# Patient Record
Sex: Female | Born: 1970 | Race: White | Hispanic: No | Marital: Married | Smoking: Former smoker
Health system: Southern US, Community
[De-identification: ages and names within clinical notes are randomized; demographics above are authoritative.]

## PROBLEM LIST (undated history)

## (undated) DIAGNOSIS — F319 Bipolar disorder, unspecified: Secondary | ICD-10-CM

## (undated) DIAGNOSIS — F419 Anxiety disorder, unspecified: Secondary | ICD-10-CM

## (undated) HISTORY — PX: REFRACTIVE SURGERY: SHX103

---

## 2006-04-27 HISTORY — PX: AUGMENTATION MAMMAPLASTY: SUR837

## 2012-06-12 ENCOUNTER — Encounter (HOSPITAL_COMMUNITY): Payer: Self-pay

## 2012-06-12 ENCOUNTER — Emergency Department (HOSPITAL_COMMUNITY)
Admission: EM | Admit: 2012-06-12 | Discharge: 2012-06-13 | Disposition: A | Discharge status: Home / Self Care | Payer: BC Managed Care – PPO | Attending: Emergency Medicine | Admitting: Emergency Medicine

## 2012-06-12 DIAGNOSIS — F1020 Alcohol dependence, uncomplicated: Secondary | ICD-10-CM

## 2012-06-12 DIAGNOSIS — F319 Bipolar disorder, unspecified: Secondary | ICD-10-CM | POA: Insufficient documentation

## 2012-06-12 DIAGNOSIS — R45851 Suicidal ideations: Secondary | ICD-10-CM | POA: Insufficient documentation

## 2012-06-12 DIAGNOSIS — Z79899 Other long term (current) drug therapy: Secondary | ICD-10-CM | POA: Insufficient documentation

## 2012-06-12 DIAGNOSIS — Z8659 Personal history of other mental and behavioral disorders: Secondary | ICD-10-CM | POA: Insufficient documentation

## 2012-06-12 DIAGNOSIS — F1021 Alcohol dependence, in remission: Secondary | ICD-10-CM | POA: Insufficient documentation

## 2012-06-12 HISTORY — DX: Bipolar disorder, unspecified: F31.9

## 2012-06-12 LAB — RAPID URINE DRUG SCREEN, HOSP PERFORMED
Amphetamines: NOT DETECTED
Barbiturates: NOT DETECTED

## 2012-06-12 LAB — SALICYLATE LEVEL: Salicylate Lvl: <2 mg/dL — ABNORMAL LOW (ref 2.8–20.0)

## 2012-06-12 LAB — CBC
HCT: 42.4 % (ref 36.0–46.0)
Hemoglobin: 14.8 g/dL (ref 12.0–15.0)
MCV: 97.2 fL (ref 78.0–100.0)
RBC: 4.36 MIL/uL (ref 3.87–5.11)
WBC: 6.1 10*3/uL (ref 4.0–10.5)

## 2012-06-12 LAB — COMPREHENSIVE METABOLIC PANEL
Alkaline Phosphatase: 56 U/L (ref 39–117)
BUN: 9 mg/dL (ref 6–23)
CO2: 29 mEq/L (ref 19–32)
Chloride: 104 mEq/L (ref 96–112)
Creatinine, Ser: 0.6 mg/dL (ref 0.50–1.10)
GFR calc non Af Amer: >90 mL/min (ref >90)
Potassium: 4 mEq/L (ref 3.5–5.1)
Total Bilirubin: 0.1 mg/dL — ABNORMAL LOW (ref 0.3–1.2)

## 2012-06-12 LAB — ETHANOL: Alcohol, Ethyl (B): 189 mg/dL — ABNORMAL HIGH (ref 0–11)

## 2012-06-12 NOTE — ED Provider Notes (Signed)
History  This chart was scribed for Janet Lewis, non-physician practitioner working with Janet Booze, MD by Janet Lewis, ED Scribe. This patient was seen in room TR08C/TR08C and the patient's care was started at 9:55 PM.   CSN: [id number]  Arrival date & time 06/12/12  2115   First MD Initiated Contact with Patient 06/12/12 2155      Chief Complaint  Patient presents with  . Suicidal     The history is provided by the patient. No language interpreter was used.   Janet Lewis is a 42 y.o. female who presents to the Emergency Department complaining of one month of intermittent, gradually worsening suicidal ideations with a plan to rent a hotel room and overdose on medications. Pt reports that she was recently diagnosed with bipolar disorder and has been on Trileptal for the past month for this. Her psychiatrist is with Janet Lewis and she reports having an appointment in 3 days for follow up. She reports prior episodes of SI with her h/o depression but denies any prior suicide attempts.  She also has h/o ETOH abuse and was in [facility]hab last year for this but admits to relapsing several times since her release in April 2013. Her last alcohol consumption was yesterday. She states that she has been drinking daily for the past 2 weeks and denies prior alcohol withdrawal seizures. She denies illegal drug use. She denies nausea, emesis, diarrhea, CP and HA as associated symptoms. She denies smoking.  Past Medical History  Diagnosis Date  . Bipolar disorder     History reviewed. No pertinent past surgical history.  No family history on file.  History  Substance Use Topics  . Smoking status: Never Smoker   . Smokeless tobacco: Never Used  . Alcohol Use: Yes    No OB history provided.  Review of Systems  Constitutional: Negative for fever.  HENT: Negative for sore throat and rhinorrhea.   Eyes: Negative for redness.  Respiratory: Negative for cough.   Cardiovascular:  Negative for chest pain.  Gastrointestinal: Negative for nausea, vomiting, abdominal pain and diarrhea.  Genitourinary: Negative for dysuria.  Musculoskeletal: Negative for myalgias.  Skin: Negative for rash.  Neurological: Negative for seizures and headaches.  Psychiatric/Behavioral: Positive for suicidal ideas.    Allergies  Sulfa antibiotics and Neosporin  Home Medications   Current Outpatient Rx  Name  Route  Sig  Dispense  Refill  . OXcarbazepine (TRILEPTAL PO)   Oral   Take 1 tablet by mouth 2 (two) times daily.           Triage Vitals: BP 135/89  Pulse 103  Temp(Src) 97.7 F (36.5 C) (Oral)  Resp 16  SpO2 97%  LMP 06/05/2012  Physical Exam  Nursing note and vitals reviewed. Constitutional: She appears well-developed and well-nourished. No distress.  HENT:  Head: Normocephalic and atraumatic.  Eyes: Conjunctivae and EOM are normal. Right eye exhibits no discharge. Left eye exhibits no discharge.  Neck: Normal range of motion. Neck supple. No tracheal deviation present.  Cardiovascular: Normal rate, regular rhythm and normal heart sounds.   Pulmonary/Chest: Effort normal and breath sounds normal. No respiratory distress.  Abdominal: Soft. There is no tenderness.  Musculoskeletal: Normal range of motion.  Neurological: She is alert.  Skin: Skin is warm and dry.  Psychiatric: She expresses suicidal ideation.    ED Course  Procedures (including critical care time)  DIAGNOSTIC STUDIES: Oxygen Saturation is 97% on room air, adequate by my interpretation.    COORDINATION  OF CARE: 10:41 PM-Discussed treatment plan which includes CBC panel, urine drug screen, and ethanol with pt at bedside and pt agreed to plan.   Labs Reviewed  COMPREHENSIVE METABOLIC PANEL - Abnormal; Notable for the following:    Glucose, Bld 113 (*)    AST 102 (*)    ALT 72 (*)    Total Bilirubin 0.1 (*)    All other components within normal limits  ETHANOL - Abnormal; Notable for the  following:    Alcohol, Ethyl (B) 189 (*)    All other components within normal limits  SALICYLATE LEVEL - Abnormal; Notable for the following:    Salicylate Lvl <2.0 (*)    All other components within normal limits  ACETAMINOPHEN LEVEL  CBC  URINE RAPID DRUG SCREEN (HOSP PERFORMED)   No results found.   1. Suicidal ideation   2. Alcoholism    Vital signs reviewed and are as follows: Filed Vitals:   06/12/12 2120  BP: 135/89  Pulse: 103  Temp: 97.7 F (36.5 C)  Resp: 16   ACT aware of patient and will see.    MDM  SI, alcoholism. Pending ACT eval.      I personally performed the services described in this documentation, which was scribed in my presence. The recorded information has been reviewed and is accurate.Renne Crigler, PA 06/12/12 [phone]

## 2012-06-12 NOTE — ED Notes (Signed)
Security notified to wand patient.  

## 2012-06-12 NOTE — ED Notes (Signed)
ACT Team at bedside.  

## 2012-06-12 NOTE — ED Notes (Signed)
Sitter at bedside.

## 2012-06-12 NOTE — ED Provider Notes (Signed)
Medical screening examination/treatment/procedure(s) were performed by non-physician practitioner and as supervising physician I was immediately available for consultation/collaboration.   [name], MD 06/12/12 [phone]

## 2012-06-12 NOTE — ED Notes (Signed)
Patient presents with suicidal ideations with a plan to rent a hotel room and overdose on medications. Recently dx Bipolar and has been on medication (Trileptal) x 1 month for this. Patient states that being on this medication has made her "feel more normal than she ever has felt." patient also has hx ETOH abuse. Was in rehab ([facility]) last year for this but admits to relapsing several times since her release in April 2013. Patient is married with 3 children and states that she has a great support system of family/friends.

## 2012-06-12 NOTE — ED Notes (Signed)
Charge RN & Norwood Levo Selena Batten) notified of patient

## 2012-06-13 ENCOUNTER — Encounter (HOSPITAL_COMMUNITY): Payer: Self-pay | Admitting: *Deleted

## 2012-06-13 ENCOUNTER — Inpatient Hospital Stay (HOSPITAL_COMMUNITY)
Admission: AD | Admit: 2012-06-13 | Discharge: 2012-06-17 | DRG: 751 | Disposition: A | Discharge status: Home / Self Care | Payer: BC Managed Care – PPO | Source: Ambulatory Visit | Attending: Psychiatry | Admitting: Psychiatry

## 2012-06-13 DIAGNOSIS — F319 Bipolar disorder, unspecified: Secondary | ICD-10-CM | POA: Diagnosis present

## 2012-06-13 DIAGNOSIS — Z79899 Other long term (current) drug therapy: Secondary | ICD-10-CM

## 2012-06-13 DIAGNOSIS — F39 Unspecified mood [affective] disorder: Secondary | ICD-10-CM

## 2012-06-13 DIAGNOSIS — F1020 Alcohol dependence, uncomplicated: Principal | ICD-10-CM

## 2012-06-13 MED ORDER — ONDANSETRON 4 MG PO TBDP: 4.0000 mg | ORAL_TABLET | Freq: Four times a day (QID) | ORAL | Status: AC | PRN

## 2012-06-13 MED ORDER — FOLIC ACID 1 MG PO TABS
1.0000 mg | ORAL_TABLET | Freq: Every day | ORAL | Status: DC
  Administered 2012-06-13: 1 mg via ORAL
  Filled 2012-06-13: qty 1

## 2012-06-13 MED ORDER — HYDROXYZINE HCL 25 MG PO TABS: 25.0000 mg | ORAL_TABLET | Freq: Four times a day (QID) | ORAL | Status: AC | PRN

## 2012-06-13 MED ORDER — ALUM & MAG HYDROXIDE-SIMETH 200-200-20 MG/5ML PO SUSP: 30.0000 mL | ORAL | Status: DC | PRN

## 2012-06-13 MED ORDER — IBUPROFEN 200 MG PO TABS
600.0000 mg | ORAL_TABLET | Freq: Three times a day (TID) | ORAL | Status: DC | PRN
  Administered 2012-06-13: 600 mg via ORAL
  Filled 2012-06-13: qty 3

## 2012-06-13 MED ORDER — ACETAMINOPHEN 325 MG PO TABS: 650.0000 mg | ORAL_TABLET | Freq: Four times a day (QID) | ORAL | Status: DC | PRN

## 2012-06-13 MED ORDER — VITAMIN B-1 100 MG PO TABS
100.0000 mg | ORAL_TABLET | Freq: Every day | ORAL | Status: DC
  Administered 2012-06-14 – 2012-06-16 (×3): 100 mg via ORAL
  Filled 2012-06-13 (×6): qty 1

## 2012-06-13 MED ORDER — TRAZODONE HCL 50 MG PO TABS
50.0000 mg | ORAL_TABLET | Freq: Every evening | ORAL | Status: DC | PRN
  Administered 2012-06-13 – 2012-06-16 (×4): 50 mg via ORAL
  Filled 2012-06-13 (×3): qty 1
  Filled 2012-06-13: qty 4

## 2012-06-13 MED ORDER — LORAZEPAM 1 MG PO TABS: 0.0000 mg | ORAL_TABLET | Freq: Four times a day (QID) | ORAL | Status: DC

## 2012-06-13 MED ORDER — VITAMIN B-1 100 MG PO TABS
100.0000 mg | ORAL_TABLET | Freq: Every day | ORAL | Status: DC
  Administered 2012-06-13: 100 mg via ORAL
  Filled 2012-06-13: qty 1

## 2012-06-13 MED ORDER — ONDANSETRON HCL 8 MG PO TABS: 4.0000 mg | ORAL_TABLET | Freq: Three times a day (TID) | ORAL | Status: DC | PRN

## 2012-06-13 MED ORDER — NALTREXONE HCL 50 MG PO TABS
25.0000 mg | ORAL_TABLET | Freq: Every day | ORAL | Status: DC
  Administered 2012-06-13 – 2012-06-17 (×5): 25 mg via ORAL
  Filled 2012-06-13: qty 1
  Filled 2012-06-13: qty 2
  Filled 2012-06-13 (×5): qty 1

## 2012-06-13 MED ORDER — OXCARBAZEPINE 300 MG PO TABS
300.0000 mg | ORAL_TABLET | Freq: Two times a day (BID) | ORAL | Status: DC
  Administered 2012-06-13 – 2012-06-17 (×8): 300 mg via ORAL
  Filled 2012-06-13 (×8): qty 1
  Filled 2012-06-13: qty 8
  Filled 2012-06-13 (×2): qty 1
  Filled 2012-06-13: qty 8

## 2012-06-13 MED ORDER — ACETAMINOPHEN 325 MG PO TABS: 650.0000 mg | ORAL_TABLET | ORAL | Status: DC | PRN

## 2012-06-13 MED ORDER — THIAMINE HCL 100 MG/ML IJ SOLN: 100.0000 mg | Freq: Every day | INTRAMUSCULAR | Status: DC

## 2012-06-13 MED ORDER — THIAMINE HCL 100 MG/ML IJ SOLN: 100.0000 mg | Freq: Once | INTRAMUSCULAR | Status: DC

## 2012-06-13 MED ORDER — CHLORDIAZEPOXIDE HCL 25 MG PO CAPS: 25.0000 mg | ORAL_CAPSULE | Freq: Four times a day (QID) | ORAL | Status: AC | PRN

## 2012-06-13 MED ORDER — ADULT MULTIVITAMIN W/MINERALS CH
1.0000 | ORAL_TABLET | Freq: Every day | ORAL | Status: DC
  Administered 2012-06-13: 1 via ORAL
  Filled 2012-06-13: qty 1

## 2012-06-13 MED ORDER — MAGNESIUM HYDROXIDE 400 MG/5ML PO SUSP: 30.0000 mL | Freq: Every day | ORAL | Status: DC | PRN

## 2012-06-13 MED ORDER — LOPERAMIDE HCL 2 MG PO CAPS: 2.0000 mg | ORAL_CAPSULE | ORAL | Status: AC | PRN

## 2012-06-13 MED ORDER — NICOTINE 21 MG/24HR TD PT24
21.0000 mg | MEDICATED_PATCH | Freq: Every day | TRANSDERMAL | Status: DC
  Filled 2012-06-13: qty 1

## 2012-06-13 MED ORDER — LORAZEPAM 1 MG PO TABS: 1.0000 mg | ORAL_TABLET | Freq: Four times a day (QID) | ORAL | Status: DC | PRN

## 2012-06-13 MED ORDER — ADULT MULTIVITAMIN W/MINERALS CH
1.0000 | ORAL_TABLET | Freq: Every day | ORAL | Status: DC
Start: 2012-06-13 — End: 2012-06-17
  Administered 2012-06-13 – 2012-06-16 (×4): 1 via ORAL
  Filled 2012-06-13 (×7): qty 1

## 2012-06-13 MED ORDER — LORAZEPAM 1 MG PO TABS: 0.0000 mg | ORAL_TABLET | Freq: Two times a day (BID) | ORAL | Status: DC

## 2012-06-13 MED ORDER — LORAZEPAM 2 MG/ML IJ SOLN: 1.0000 mg | Freq: Four times a day (QID) | INTRAMUSCULAR | Status: DC | PRN

## 2012-06-13 NOTE — Tx Team (Signed)
Initial Interdisciplinary Treatment Plan  PATIENT STRENGTHS: (choose at least two) Ability for insight Active sense of humor Average or above average intelligence Capable of independent living Communication skills General fund of knowledge Motivation for treatment/growth Physical Health Supportive family/friends Work skills  PATIENT STRESSORS: Marital or family conflict Substance abuse   PROBLEM LIST: Problem List/Patient Goals Date to be addressed Date deferred Reason deferred Estimated date of resolution  Worried about her spouse he kicks me out every time I binge drink 06/13/2012   06/18/2012  My job is the only thing my drinking has not affected 06/13/12   06/18/2012  I have just started taking trileptal for my mood disorder and I believe the doctor was going to increase my medication 06/13/2012   06/18/2012                                       DISCHARGE CRITERIA:  Ability to meet basic life and health needs Adequate post-discharge living arrangements Improved stabilization in mood, thinking, and/or behavior Motivation to continue treatment in a less acute level of care Need for constant or close observation no longer present Verbal commitment to aftercare and medication compliance Withdrawal symptoms are absent or subacute and managed without 24-hour nursing intervention  PRELIMINARY DISCHARGE PLAN: Attend aftercare/continuing care group Attend 12-step recovery group Return to previous living arrangement Return to previous work or school arrangements  PATIENT/FAMIILY INVOLVEMENT: This treatment plan has been presented to and reviewed with the patient, Janet Lewis..  The patient and family have been given the opportunity to ask questions and make suggestions.  Jule Ser 06/13/2012, 7:50 PM

## 2012-06-13 NOTE — Progress Notes (Signed)
Pt reports she just came in this evening for alcohol detox.  She said she is not really having any withdrawal symptoms at this time, because it has been about 3 days since she has had a drink.  She said her husband had kicked her out of the house to get her away from the kids, and she had been staying with her parents.  She denies SI/HI/AV.  She said the MD has ordered her something for cravings, as that is her biggest obstacle.  She is hoping that it will help her to curb the desire to drink.  Pt's thoughts were clear in conversation.  Support/encouragement given.  Pt encouraged to make her needs known to staff.  Pt voiced understanding.  Safety maintained with q15 minute checks.

## 2012-06-13 NOTE — ED Provider Notes (Addendum)
Janet Lewis is a 42 y.o. female here with suicidal ideations and substance abuse. Came in last night. Sleeping comfortably this AM, no issues as per nursing. Will get telepsych and ACT consult today. Will also place on CIWA protocol given hx of recent alcohol binge. Medically stable otherwise.    Richardean Canal, MD 06/13/12 [phone]  11:14 AM Patient accepted at [facility] under Dr. Les Pou. Stable for transfer.   Richardean Canal, MD 06/13/12 1115

## 2012-06-13 NOTE — ED Notes (Signed)
TELEPSYCH ORDERED

## 2012-06-13 NOTE — Progress Notes (Signed)
[facility] LCSW Group Therapy  06/13/2012   Type of Therapy:  Group Therapy  Participation Level:  Did Not Attend; new patient was meeting with physician   Clide Dales 06/13/2012, 5:29 PM

## 2012-06-13 NOTE — Progress Notes (Signed)
Adult Psychoeducational Group Note  Date:  06/13/2012 Time:  2000  Group Topic/Focus:  AA group  Participation Level:  Active  Participation Quality:  Appropriate and Attentive  Affect:  Appropriate  Cognitive:  Alert and Appropriate  Insight: Appropriate  Engagement in Group:  Engaged  Modes of Intervention:  Support  Additional Comments:    Humberto Seals Monique 06/13/2012, 11:20 PM

## 2012-06-13 NOTE — [facility] Suicide Risk Assessment (Signed)
Suicide Risk Assessment  Admission Assessment     Nursing information obtained from:    Demographic factors:    Current Mental Status:    Loss Factors:    Historical Factors:    Risk Reduction Factors:     CLINICAL FACTORS:   Bipolar Disorder:   Depressive phase Alcohol/Substance Abuse/Dependencies  COGNITIVE FEATURES THAT CONTRIBUTE TO RISK: No evidence   SUICIDE RISK:   Mild:  Suicidal ideation of limited frequency, intensity, duration, and specificity.  There are no identifiable plans, no associated intent, mild dysphoria and related symptoms, good self-control (both objective and subjective assessment), few other risk factors, and identifiable protective factors, including available and accessible social support.  PLAN OF CARE: Librium detox                              Supportive approach/coping skills/relapse prevention                               Increase Trileptal                               Start Naltrexone  I certify that inpatient services furnished can reasonably be expected to improve the patient's condition.  Laiklyn Pilkenton A 06/13/2012, 2:39 PM

## 2012-06-13 NOTE — BH Assessment (Signed)
Assessment Note   Janet Lewis is an 42 y.o. female. Pt reports long history of alcohol problems.  Pt was supposed to take vacation with husband this past week but was snowed out.  Today, after 3 weeks sobriety, pt drank again and was confronted by her husband.  After an argument, he told her to leave.  Pt reporting SI with plan to check into a hotel and take an overdose of pills.  Pt reports he husband monitors her every move to try to prevent her from drinking.  Pt has been somewhat recently diagnosed with bipolar disorder and started meds 1 month ago, which has been helpful.  Pt does report that, despite attending AA meetings, her urge to drink is still very strong.  Pt was at [facility] 07/2011 but did not remain sober.  Pt does report depression, SI with plan, denies HI/AV.  Pt does not report current withdrawals.  Axis I: alcohol dependence, bipolar disorder Axis II: Deferred Axis III:  Past Medical History  Diagnosis Date  . Bipolar disorder    Axis IV: problems with primary support group Axis V: 21-30 behavior considerably influenced by delusions or hallucinations OR serious impairment in judgment, communication OR inability to function in almost all areas  Past Medical History:  Past Medical History  Diagnosis Date  . Bipolar disorder     History reviewed. No pertinent past surgical history.  Family History: No family history on file.  Social History:  reports that she has never smoked. She has never used smokeless tobacco. She reports that  drinks alcohol. She reports that she does not use illicit drugs.  Additional Social History:  Alcohol / Drug Use Pain Medications: Pt denies. Prescriptions: Pt denies. Over the Counter: Pt denies. History of alcohol / drug use?: Yes Longest period of sobriety (when/how long): 3 weeks-ended today Negative Consequences of Use: Personal relationships;Financial;Legal Substance #1 Name of Substance 1: alcohol 1 - Age of First Use:  16 1 - Amount (size/oz): 1/2 pint vodka 1 - Frequency: daily, by historty--has not drank for 3 weeks until today. 1 - Duration: 5-6 years 1 - Last Use / Amount: 2/16 1 bottle wine  CIWA: CIWA-Ar BP: 135/89 mmHg Pulse Rate: 103 COWS:    Allergies:  Allergies  Allergen Reactions  . Sulfa Antibiotics Hives  . Neosporin (Neomycin-Bacitracin Zn-Polymyx) Rash    Home Medications:  (Not in a hospital admission)  OB/GYN Status:  Patient's last menstrual period was 06/05/2012.  General Assessment Data [facility] of Assessment: [facility] ED ACT Assessment: Yes Living Arrangements: Spouse/significant other;Children Can pt return to current living arrangement?: Yes Admission Status: Voluntary     Risk to self Suicidal Ideation: Yes-Currently Present Suicidal Intent: Yes-Currently Present Is patient at risk for suicide?: Yes Suicidal Plan?: Yes-Currently Present Specify Current Suicidal Plan: overdose Access to Means: Yes Specify Access to Suicidal Means: buy pills What has been your use of drugs/alcohol within the last 12 months?: long history of alcohol problems Previous Attempts/Gestures: No Intentional Self Injurious Behavior: None Family Suicide History: No Recent stressful life event(s): Conflict (Comment);Legal Issues (with husband related to pt alcohol use, pending DWI) Persecutory voices/beliefs?: No Depression: Yes Depression Symptoms: Despondent;Tearfulness;Isolating;Fatigue;Guilt;Loss of interest in usual pleasures;Feeling worthless/self pity;Feeling angry/irritable Substance abuse history and/or treatment for substance abuse?: Yes Suicide prevention information given to non-admitted patients: Not applicable  Risk to Others Homicidal Ideation: No Thoughts of Harm to Others: No Current Homicidal Intent: No Current Homicidal Plan: No Access to Homicidal Means: No History of harm  to others?: No Assessment of Violence: None Noted Does patient have access to weapons?: Yes  (Comment) (hunting guns in home) Criminal Charges Pending?: Yes Describe Pending Criminal Charges: DWI Does patient have a court date: Yes Court Date: 07/18/12  Psychosis Hallucinations: None noted Delusions: None noted  Mental Status Report Appear/Hygiene: Other (Comment) (casual) Eye Contact: Good Motor Activity: Unremarkable Speech: Other (Comment) (clear speech but vague in what she was saying) Level of Consciousness: Alert Mood: Depressed Affect: Appropriate to circumstance Anxiety Level: Minimal Thought Processes: Relevant Judgement: Unimpaired Orientation: Person;Place;Time;Situation Obsessive Compulsive Thoughts/Behaviors: None  Cognitive Functioning Concentration: Normal Memory: Recent Intact;Remote Intact IQ: Average Insight: Good Impulse Control: Fair Appetite: Good Weight Loss: 0 Weight Gain: 20 Sleep: No Change Total Hours of Sleep: 5 Vegetative Symptoms: None  ADLScreening [facility] Assessment Services) Patient's cognitive ability adequate to safely complete daily activities?: Yes Patient able to express need for assistance with ADLs?: Yes Independently performs ADLs?: Yes (appropriate for developmental age)  Abuse/Neglect [facility]) Physical Abuse: Denies Verbal Abuse: Denies Sexual Abuse: Denies  Prior Inpatient Therapy Prior Inpatient Therapy: Yes Prior Therapy Dates: 07/2011 Prior Therapy Facilty[facility]Provider[facility]): [facility] Reason for Treatment: alcohol  Prior Outpatient Therapy Prior Outpatient Therapy: Yes Prior Therapy Dates: current Prior Therapy Facilty/Provider(s): [facility] Reason for Treatment: meds  ADL Screening (condition at time of admission) Patient's cognitive ability adequate to safely complete daily activities?: Yes Patient able to express need for assistance with ADLs?: Yes Independently performs ADLs?: Yes (appropriate for developmental age) Weakness of Legs: None Weakness of Arms/Hands: None  Home Assistive  Devices/Equipment Home Assistive Devices/Equipment: None    Abuse/Neglect Assessment (Assessment to be complete while patient is alone) Physical Abuse: Denies Verbal Abuse: Denies Sexual Abuse: Denies Exploitation of patient/patient's resources: Denies Self-Neglect: Denies Values / Beliefs Cultural Requests During Hospitalization: None Spiritual Requests During Hospitalization: None   Advance Directives (For Healthcare) Advance Directive: Patient does not have advance directive;Patient would not like information    Additional Information 1:1 In Past 12 Months?: No CIRT Risk: No Elopement Risk: No Does patient have medical clearance?: Yes     Disposition:  Disposition Disposition of Patient: Inpatient treatment program Type of inpatient treatment program: Adult  On Site Evaluation by:   Reviewed with Physician:     Lorri Frederick 06/13/2012 12:01 AM

## 2012-06-13 NOTE — Progress Notes (Signed)
Pt was admitted today for substance abuse today.  She has been drinking for the past 5-6 years and has only remained sober for about 7 days (except when she has been somewhere to detox) She is now wanting help her husband of 20 years kicks her out of their home and away from the children.  She has been to [facility] several times but never here for treatment.  She has no pertinent medical issues only breast implants around 2008.  She denies any S/H ideation or A/V hallucination.  Her CIWA was a 4 on admission with minor nausea and anxiety.  She will not be started on the protocol but has prn librium as needed.

## 2012-06-13 NOTE — ED Notes (Signed)
REPORT HAS BEEN CALLED TO BH. SECURITY AWARE AND HERE FOR TRANSPORT

## 2012-06-13 NOTE — Progress Notes (Signed)
Pt accepted by Dr Dan Humphreys to Dr Dub Mikes pending bed availability.

## 2012-06-13 NOTE — H&P (Signed)
Psychiatric Admission Assessment Adult  Patient Identification:  Janet Lewis Date of Evaluation:  06/13/2012 Chief Complaint:  Alcohol Dependence 303.90 Bipolar Disorder NOS 296.80 History of Present Illness:: Back in March 2013 she went to [facility] 3 days for detox, then 28 days in [facility] .She relapsed a month after she left. The cravings were still there. She recognizes that she has a Bipolar Disorder. Started drinking to "stop the madness." Racing thoughts, recognizes that they have been going on for 15 years. Started drinking 6 years ago, and progressed rapidly. Had post partum first two pregnancies, given Zoloft 8 weeks. Found it helpful Seasonal component. Used to pick her skin Elements:  Location:  In patient. Quality:  affecting her everyday. Severity:  moderate to severe. Timing:  every day. Duration:  worst last several months. Context:  Alcohol dependence with an underlying mood disorder. Associated Signs/Synptoms: Depression Symptoms:  depressed mood, insomnia, psychomotor retardation, fatigue, recurrent thoughts of death, suicidal thoughts without plan, anxiety, panic attacks, insomnia, loss of energy/fatigue, disturbed sleep, weight gain, mostly when she drinks (Hypo) Manic Symptoms:  Impulsivity, Irritable Mood,racing thoughts, no sleep when she is that state, spending money, labile Anxiety Symptoms:  Excessive Worry, Panic Symptoms, Psychotic Symptoms:  Denies PTSD Symptoms: Had a traumatic exposure:  abortion at 17  Psychiatric Specialty Exam: Physical Exam  ROS  Blood pressure 131/91, pulse 88, temperature 96.2 F (35.7 C), temperature source Oral, height 5\' 7"  (1.702 m), weight 81.647 kg (180 lb), last menstrual period 06/05/2012.Body mass index is 28.19 kg/(m^2).  General Appearance: Fairly Groomed  Patent attorney::  Fair  Speech:  Clear and Coherent and Normal Rate  Volume:  Decreased  Mood:  Anxious, Depressed and worried  Affect:   Restricted  Thought Process:  Coherent and Goal Directed  Orientation:  Full (Time, Place, and Person)  Thought Content:  worries, concerns  Suicidal Thoughts:  No  Homicidal Thoughts:  No  Memory:  Immediate;   Fair Recent;   Fair Remote;   Fair  Judgement:  Fair  Insight:  Present  Psychomotor Activity:  Normal  Concentration:  Fair  Recall:  Fair  Akathisia:  No  Handed:  Right  AIMS (if indicated):     Assets:  Desire for Improvement Housing Social Support Talents/Skills Vocational/Educational  Sleep:       Past Psychiatric History: Diagnosis: Alcohol Dependence, Mood Disorder  Hospitalizations: [facility].   Outpatient Care:  Substance Abuse Care: [facility]   Self-Mutilation: Denies  Suicidal Attempts:Denies  Violent Behaviors:Denies   Past Medical History:   Past Medical History  Diagnosis Date  . Bipolar disorder    Seizure History:  coning off the alcohol Allergies:   Allergies  Allergen Reactions  . Sulfa Antibiotics Hives  . Neosporin (Neomycin-Bacitracin Zn-Polymyx) Rash   PTA Medications: Prescriptions prior to admission  Medication Sig Dispense Refill  . OXcarbazepine (TRILEPTAL PO) Take 1 tablet by mouth 2 (two) times daily.        Previous Psychotropic Medications:  Medication/Dose  Zoloft, Celexa, Trileptal               Substance Abuse History in the last 12 months:  yes  Consequences of Substance Abuse: Legal Consequences:  DWI, drving without a license Family Consequences:  stress in the relationship Blackouts:   Withdrawal Symptoms:   cravings  Social History:  reports that she has never smoked. She has never used smokeless tobacco. She reports that she drinks about  4.8 ounces of alcohol per week. She reports that she does not use illicit drugs. Additional Social History: Pain Medications: pt denies Prescriptions: pt denies Over the Counter: pt denies History of alcohol / drug  use?: Yes Longest period of sobriety (when/how long): 7 days sober Negative Consequences of Use: Personal relationships;Legal;Financial Withdrawal Symptoms: Nausea / Vomiting;Other (Comment) (anxiety) Name of Substance 1: alcohol sunday night 1 - Age of First Use: 16 1 - Amount (size/oz): 1/2 pint vodka per day 1 - Frequency: daily 1 - Duration: 6 years 1 - Last Use / Amount: 2/16 1 bottle wine                  Current Place of Residence:   Place of Birth:   Family Members:  Marital Status:  Married Children:  Sons: 14, 9  Daughters: 17 Relationships: Education:  2 years occupational therapy Educational Problems/Performance: Religious Beliefs/Practices: History of Abuse (Emotional/Phsycial/Sexual) Occupational Experiences; In nursing home Military History:  None. Legal History: DWI Hobbies/Interests:  Family History:  History reviewed. No pertinent family history., Alcoholism Bipolar Disorder  Results for orders placed during the hospital encounter of 06/12/12 (from the past 72 hour(s))  ACETAMINOPHEN LEVEL     Status: None   Collection Time    06/12/12  9:49 PM      Result Value Range   Acetaminophen (Tylenol), Serum <15.0  10 - 30 ug/mL   Comment:            THERAPEUTIC CONCENTRATIONS VARY     SIGNIFICANTLY. A RANGE OF 10-30     ug/mL MAY BE AN EFFECTIVE     CONCENTRATION FOR MANY PATIENTS.     HOWEVER, SOME ARE BEST TREATED     AT CONCENTRATIONS OUTSIDE THIS     RANGE.     ACETAMINOPHEN CONCENTRATIONS     >150 ug/mL AT 4 HOURS AFTER     INGESTION AND >50 ug/mL AT 12     HOURS AFTER INGESTION ARE     OFTEN ASSOCIATED WITH TOXIC     REACTIONS.  CBC     Status: None   Collection Time    06/12/12  9:49 PM      Result Value Range   WBC 6.1  4.0 - 10.5 K/uL   RBC 4.36  3.87 - 5.11 MIL/uL   Hemoglobin 14.8  12.0 - 15.0 g/dL   HCT 40.9  81.1 - 91.4 %   MCV 97.2  78.0 - 100.0 fL   MCH 33.9  26.0 - 34.0 pg   MCHC 34.9  30.0 - 36.0 g/dL   RDW 78.2  95.6 -  21.3 %   Platelets 239  150 - 400 K/uL  COMPREHENSIVE METABOLIC PANEL     Status: Abnormal   Collection Time    06/12/12  9:49 PM      Result Value Range   Sodium 142  135 - 145 mEq/L   Potassium 4.0  3.5 - 5.1 mEq/L   Chloride 104  96 - 112 mEq/L   CO2 29  19 - 32 mEq/L   Glucose, Bld 113 (*) 70 - 99 mg/dL   BUN 9  6 - 23 mg/dL   Creatinine, Ser 0.86  0.50 - 1.10 mg/dL   Calcium 9.1  8.4 - 57.8 mg/dL   Total Protein 7.6  6.0 - 8.3 g/dL   Albumin 3.8  3.5 - 5.2 g/dL   AST 469 (*) 0 - 37 U/L   ALT 72 (*) 0 -  35 U/L   Alkaline Phosphatase 56  39 - 117 U/L   Total Bilirubin 0.1 (*) 0.3 - 1.2 mg/dL   GFR calc non Af Amer >90  >90 mL/min   GFR calc Af Amer >90  >90 mL/min   Comment:            The eGFR has been calculated     using the CKD EPI equation.     This calculation has not been     validated in all clinical     situations.     eGFR's persistently     <90 mL/min signify     possible Chronic Kidney Disease.  ETHANOL     Status: Abnormal   Collection Time    06/12/12  9:49 PM      Result Value Range   Alcohol, Ethyl (B) 189 (*) 0 - 11 mg/dL   Comment:            LOWEST DETECTABLE LIMIT FOR     SERUM ALCOHOL IS 11 mg/dL     FOR MEDICAL PURPOSES ONLY  SALICYLATE LEVEL     Status: Abnormal   Collection Time    06/12/12  9:49 PM      Result Value Range   Salicylate Lvl <2.0 (*) 2.8 - 20.0 mg/dL  URINE RAPID DRUG SCREEN (HOSP PERFORMED)     Status: None   Collection Time    06/12/12  9:54 PM      Result Value Range   Opiates NONE DETECTED  NONE DETECTED   Cocaine NONE DETECTED  NONE DETECTED   Benzodiazepines NONE DETECTED  NONE DETECTED   Amphetamines NONE DETECTED  NONE DETECTED   Tetrahydrocannabinol NONE DETECTED  NONE DETECTED   Barbiturates NONE DETECTED  NONE DETECTED   Comment:            DRUG SCREEN FOR MEDICAL PURPOSES     ONLY.  IF CONFIRMATION IS NEEDED     FOR ANY PURPOSE, NOTIFY LAB     WITHIN 5 DAYS.                LOWEST DETECTABLE LIMITS      FOR URINE DRUG SCREEN     Drug Class       Cutoff (ng/mL)     Amphetamine      1000     Barbiturate      200     Benzodiazepine   200     Tricyclics       300     Opiates          300     Cocaine          300     THC              50   Psychological Evaluations:  Assessment:   AXIS I:  Alcohol Dependence, Mood Disorder NOS AXIS II:  Deferred AXIS III:   Past Medical History  Diagnosis Date  . Bipolar disorder    AXIS IV:  problems related to legal system/crime and problems with primary support group AXIS V:  51-60 moderate symptoms  Treatment Plan/Recommendations:  Supportive approach/coping skills/relapse prevention  Librium Detox                                                                  Reassess co morbidities  Treatment Plan Summary: Daily contact with patient to assess and evaluate symptoms and progress in treatment Medication management Current Medications:  No current facility-administered medications for this encounter.    Observation Level/Precautions:  15 minute checks  Laboratory:  As per ED  Psychotherapy:  Individual/Group/relapse prevention    Medications:  Librium Detox  Consultations:    Discharge Concerns:    Estimated LOS: 5-7 days  Other:     I certify that inpatient services furnished can reasonably be expected to improve the patient's condition.   Tennille Montelongo A 2/17/20141:53 PM

## 2012-06-14 NOTE — Progress Notes (Signed)
Adult Psychoeducational Group Note  Date:  06/14/2012 Time:  2000 Group Topic/Focus:  AA--Addiction  Participation Level:  Active  Participation Quality:  Appropriate, Attentive, Sharing and Supportive  Affect:  Appropriate  Cognitive:  Alert, Appropriate and Oriented  Insight: Appropriate  Engagement in Group:  Engaged and Supportive  Modes of Intervention:  Socialization and Support  Additional Comments:  AA members did not arrive; patients watched a video on addiction. Pt remained throughout the entirety of the group; pt was supportive and shared with peers.   Humberto [facility] 06/14/2012, 9:38 PM

## 2012-06-14 NOTE — Progress Notes (Signed)
Pt observed in the dayroom watching TV with her peers.  In 1:1 conversation, pt reports she is doing ok today.  She is not sure if she wants to go to a rehab before going home or not.  She talked with Dr. Dub Mikes about taking an injection instead of trusting herself to take a daily pill.  She is still thinking about her options.  She is thinking in a logical manner.  She denies SI/HI/AV.  She voices no needs/concerns.  Support/encouragement given.  Safety maintained with q15 minutes.

## 2012-06-14 NOTE — Progress Notes (Signed)
[facility] MD Progress Note  06/14/2012 10:18 AM Janet Lewis  MRN:  [id number]  Subjective:  "I actually feel a lot better today than I was few days ago. I slept really well last night for a place like this. I came here because of my alcoholism. [facility]. I drink alcohol as a self medication for having bipolar disorder. I did not want to admit that I have this mental illness, and I was not prepared to deal with taking medicines everyday for being crazy. I drink to help me sleep at night. I drink to help me with all the stuff going in my thinking/my head. I have an uncle who has manic type of bipolar disorder. He is really crazy. He does not take his medications, and as a result he looks and acts crazy. He used to tell me that I am like him and will always be like him. I have started medication for bipolar disorder recently, but I was not complaint with it. I will stop my medicines so that I can drink my alcohol. I crave alcohol, think about alcohol all the time. I have done AA meetings, will still do AA meetings after I get out from here. Alcoholism has done a number on me. It has affected my relationships, family, life and job. I was charged with DUI last May.  I am doing things differently once I leave here. I will continue my AA/NA meetings and join a peer group to facilitate my healing. I pray that the new medicine Naltrexone will help me deal with my alcoholism even further".  Diagnosis:   Axis I: Alcohol dependence, Unspecified mood disorder. Axis II: Deferred Axis III:  Past Medical History  Diagnosis Date  . Bipolar disorder    Axis IV: other psychosocial or environmental problems and Substance abuse issues. Axis V: 41-50 serious symptoms  ADL's:  Intact  Sleep: Good  Appetite:  Fair  Suicidal Ideation: "No" Plan:  Denies Intent:  Denies Means:  Denies  Homicidal Ideation: "No" Plan:  No Intent:  No Means:  No  AEB (as evidenced  by): per patient's report.  Psychiatric Specialty Exam: Review of Systems  Constitutional: Negative.   HENT: Negative.   Eyes: Negative.   Respiratory: Negative.   Cardiovascular: Negative.   Gastrointestinal: Negative.   Genitourinary: Negative.   Musculoskeletal: Negative.   Skin: Negative.   Neurological: Negative.   Endo/Heme/Allergies: Negative.   Psychiatric/Behavioral: Positive for substance abuse. Negative for depression, suicidal ideas, hallucinations and memory loss. The patient has insomnia. The patient is not nervous/anxious.     Blood pressure 118/81, pulse 96, temperature 97.9 F (36.6 C), temperature source Oral, resp. rate 16, height 5\' 7"  (1.702 m), weight 81.647 kg (180 lb), last menstrual period 06/05/2012.Body mass index is 28.19 kg/(m^2).  General Appearance: Casual and Fairly Groomed  Patent attorney::  Fair  Speech:  Clear and Coherent and pressured to some extent  Volume:  Increased  Mood:  "I feel a little anxious".  Affect:  Blunt  Thought Process:  Coherent and Goal Directed  Orientation:  Full (Time, Place, and Person)  Thought Content:  Rumination and Denies hallucinations, delusions, paranoia  Suicidal Thoughts:  No  Homicidal Thoughts:  No  Memory:  Immediate;   Good Recent;   Good Remote;   Good  Judgement:  Fair  Insight:  Fair  Psychomotor Activity:  Anxious  Concentration:  Fair  Recall:  Good  Akathisia:  No  Handed:  Right  AIMS (if indicated):     Assets:  Desire for Improvement  Sleep:  Number of Hours: 6.75   Current Medications: Current Facility-Administered Medications  Medication Dose Route Frequency Provider Last Rate Last Dose  . acetaminophen (TYLENOL) tablet 650 mg  650 mg Oral Q6H PRN Rachael Fee, MD      . alum & mag hydroxide-simeth (MAALOX/MYLANTA) 200-200-20 MG/5ML suspension 30 mL  30 mL Oral Q4H PRN Rachael Fee, MD      . chlordiazePOXIDE (LIBRIUM) capsule 25 mg  25 mg Oral Q6H PRN Rachael Fee, MD      .  hydrOXYzine (ATARAX/VISTARIL) tablet 25 mg  25 mg Oral Q6H PRN Rachael Fee, MD      . loperamide (IMODIUM) capsule 2-4 mg  2-4 mg Oral PRN Rachael Fee, MD      . magnesium hydroxide (MILK OF MAGNESIA) suspension 30 mL  30 mL Oral Daily PRN Rachael Fee, MD      . multivitamin with minerals tablet 1 tablet  1 tablet Oral Daily Rachael Fee, MD   1 tablet at 06/14/12 1610  . naltrexone (DEPADE) tablet 25 mg  25 mg Oral Daily Rachael Fee, MD   25 mg at 06/14/12 0802  . ondansetron (ZOFRAN-ODT) disintegrating tablet 4 mg  4 mg Oral Q6H PRN Rachael Fee, MD      . Oxcarbazepine (TRILEPTAL) tablet 300 mg  300 mg Oral BID Rachael Fee, MD   300 mg at 06/14/12 0802  . thiamine (B-1) injection 100 mg  100 mg Intramuscular Once Rachael Fee, MD      . thiamine (VITAMIN B-1) tablet 100 mg  100 mg Oral Daily Rachael Fee, MD   100 mg at 06/14/12 0803  . traZODone (DESYREL) tablet 50 mg  50 mg Oral QHS PRN Rachael Fee, MD   50 mg at 06/13/12 2201    Lab Results:  Results for orders placed during the hospital encounter of 06/12/12 (from the past 48 hour(s))  ACETAMINOPHEN LEVEL     Status: None   Collection Time    06/12/12  9:49 PM      Result Value Range   Acetaminophen (Tylenol), Serum <15.0  10 - 30 ug/mL   Comment:            THERAPEUTIC CONCENTRATIONS VARY     SIGNIFICANTLY. A RANGE OF 10-30     ug/mL MAY BE AN EFFECTIVE     CONCENTRATION FOR MANY PATIENTS.     HOWEVER, SOME ARE BEST TREATED     AT CONCENTRATIONS OUTSIDE THIS     RANGE.     ACETAMINOPHEN CONCENTRATIONS     >150 ug/mL AT 4 HOURS AFTER     INGESTION AND >50 ug/mL AT 12     HOURS AFTER INGESTION ARE     OFTEN ASSOCIATED WITH TOXIC     REACTIONS.  CBC     Status: None   Collection Time    06/12/12  9:49 PM      Result Value Range   WBC 6.1  4.0 - 10.5 K/uL   RBC 4.36  3.87 - 5.11 MIL/uL   Hemoglobin 14.8  12.0 - 15.0 g/dL   HCT 96.0  45.4 - 09.8 %   MCV 97.2  78.0 - 100.0 fL   MCH 33.9  26.0 - 34.0 pg    MCHC 34.9  30.0 - 36.0  g/dL   RDW 40.9  81.1 - 91.4 %   Platelets 239  150 - 400 K/uL  COMPREHENSIVE METABOLIC PANEL     Status: Abnormal   Collection Time    06/12/12  9:49 PM      Result Value Range   Sodium 142  135 - 145 mEq/L   Potassium 4.0  3.5 - 5.1 mEq/L   Chloride 104  96 - 112 mEq/L   CO2 29  19 - 32 mEq/L   Glucose, Bld 113 (*) 70 - 99 mg/dL   BUN 9  6 - 23 mg/dL   Creatinine, Ser 7.82  0.50 - 1.10 mg/dL   Calcium 9.1  8.4 - 95.6 mg/dL   Total Protein 7.6  6.0 - 8.3 g/dL   Albumin 3.8  3.5 - 5.2 g/dL   AST 213 (*) 0 - 37 U/L   ALT 72 (*) 0 - 35 U/L   Alkaline Phosphatase 56  39 - 117 U/L   Total Bilirubin 0.1 (*) 0.3 - 1.2 mg/dL   GFR calc non Af Amer >90  >90 mL/min   GFR calc Af Amer >90  >90 mL/min   Comment:            The eGFR has been calculated     using the CKD EPI equation.     This calculation has not been     validated in all clinical     situations.     eGFR's persistently     <90 mL/min signify     possible Chronic Kidney Disease.  ETHANOL     Status: Abnormal   Collection Time    06/12/12  9:49 PM      Result Value Range   Alcohol, Ethyl (B) 189 (*) 0 - 11 mg/dL   Comment:            LOWEST DETECTABLE LIMIT FOR     SERUM ALCOHOL IS 11 mg/dL     FOR MEDICAL PURPOSES ONLY  SALICYLATE LEVEL     Status: Abnormal   Collection Time    06/12/12  9:49 PM      Result Value Range   Salicylate Lvl <2.0 (*) 2.8 - 20.0 mg/dL  URINE RAPID DRUG SCREEN (HOSP PERFORMED)     Status: None   Collection Time    06/12/12  9:54 PM      Result Value Range   Opiates NONE DETECTED  NONE DETECTED   Cocaine NONE DETECTED  NONE DETECTED   Benzodiazepines NONE DETECTED  NONE DETECTED   Amphetamines NONE DETECTED  NONE DETECTED   Tetrahydrocannabinol NONE DETECTED  NONE DETECTED   Barbiturates NONE DETECTED  NONE DETECTED   Comment:            DRUG SCREEN FOR MEDICAL PURPOSES     ONLY.  IF CONFIRMATION IS NEEDED     FOR ANY PURPOSE, NOTIFY LAB     WITHIN 5 DAYS.                 LOWEST DETECTABLE LIMITS     FOR URINE DRUG SCREEN     Drug Class       Cutoff (ng/mL)     Amphetamine      1000     Barbiturate      200     Benzodiazepine   200     Tricyclics       300     Opiates  300     Cocaine          300     THC              50    Physical Findings: AIMS: Facial and Oral Movements Muscles of Facial Expression: None, normal Lips and Perioral Area: None, normal Jaw: None, normal Tongue: None, normal,Extremity Movements Upper (arms, wrists, hands, fingers): None, normal Lower (legs, knees, ankles, toes): None, normal, Trunk Movements Neck, shoulders, hips: None, normal, Overall Severity Severity of abnormal movements (highest score from questions above): None, normal Incapacitation due to abnormal movements: None, normal Patient's awareness of abnormal movements (rate only patient's report): No Awareness, Dental Status Current problems with teeth and/or dentures?: No Does patient usually wear dentures?: No  CIWA:  CIWA-Ar Total: 0 COWS:     Treatment Plan Summary: Daily contact with patient to assess and evaluate symptoms and progress in treatment Medication management  Plan: Supportive approach/coping skills/relapse prevention. Encouraged out of room, participation in group sessions and application of coping skills when distressed. Will continue to monitor response to/adverse effects of medications in use to assure effectiveness. Continue to monitor mood, behavior and interaction with staff and other patients. Continue current plan of care.  Medical Decision Making Problem Points:  Established problem, stable/improving (1), Review of last therapy session (1) and Review of psycho-social stressors (1) Data Points:  Review and summation of old records (2) Review of medication regiment & side effects (2)  I certify that inpatient services furnished can reasonably be expected to improve the patient's condition.   Armandina Stammer  I 06/14/2012, 10:18 AM

## 2012-06-14 NOTE — Progress Notes (Signed)
06/14/2012  Type of Therapy:  Group Therapy 1:15 to 2:30  Participation Level:  Active  Participation Quality:  Appropriate, Attentive and Sharing  Affect:  Appropriate  Cognitive:  Appropriate  Insight:  Developing/Improving  Engagement in Therapy:  Developing/Improving Engaged  Modes of Intervention:  Education, Orientation, Rapport Building, Socialization and Support  Summary of Progress/Problems: Patient attended group presentation by staff member of  [employer] [facility] [employer] [facility] ([facility]). Janet Lewis was attentive and appropriate during session and appeared to be able to relate to visitor sharing about importance of putting his recovery first above other concerns such as relationships, etc. She also shared about the peer support group through [employer] that she hopes to sign up for in [place]  Clide Dales 06/14/2012

## 2012-06-14 NOTE — Progress Notes (Signed)
D: Patient denies SI/HI and A/V hallucinations; patient reports sleep was well and that she did receive a medication to help; reports appetite is good ; reports energy level is normal ; reports ability to pay attention is good; rates depression as 2/10; rates hopelessness 1/10; rates anxiety as 1 or 2/10; patient denies all withdrawal symptoms and stats she has never had a seizure due to withdrawal  A: Monitored q 15 minutes; patient encouraged to attend groups; patient educated about medications; patient given medications per physician orders; patient encouraged to express feelings and/or concerns; librium every six hours as needed  R: Patient is appropriate to circumstances and has some insight and is presently denying the cravings ; patient's interaction with staff and peers is assertive but appropriate;  patient is taking medications as prescribed and tolerating medications; patient is attending all groups

## 2012-06-14 NOTE — Progress Notes (Signed)
[facility] Group Notes:  (Nursing/MHT/Case Management/Adjunct)  Type of Therapy:  Psychoeducational Skills  Participation Level:  Active  Participation Quality:  Appropriate, Attentive and Sharing  Affect:  Appropriate  Cognitive:  Alert, Appropriate and Oriented  Insight:  Appropriate  Engagement in Group:  Engaged  Modes of Intervention:  Activity, Discussion, Education, Problem-solving, Rapport Building, Socialization and Support  Summary of Progress/Problems: Janet Lewis attended psychoeducational group on labels. 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 Janet Lewis was active while group discussed what labels are, how we use them, how they affect the way we think about and perceive the world, and listed positive and negative labels they have used or been called. Janet Lewis stated a negative label she has dealt with is "addict". Janet Lewis was given homework assignment to list 10 words she has been labeled and to find the reality of the situation/label.    Wandra Scot 06/14/2012 6:16 PM

## 2012-06-14 NOTE — Progress Notes (Signed)
Recreation Therapy Notes  06/14/2012         Time: 3:00pm      Group Topic/Focus: Teamwork, Communication, Decision Making  Participation Level: Active  Participation Quality: Appropriate  Affect: Appropriate  Cognitive: Appropriate   Additional Comments: Patient with peers were given the following instructions: You and your friends have just discovered a new piece of land no ones has any knowledge of. As a group you must decide which of the following 7 things you want to address: 1 - Name the Country 2 - Design a license plate 3 - Design a flag 4 - Choose a national bird 5 - Choose a Agricultural consultant 6 - Appoint yourselves to government offices 9 - Create any necessary laws. Patient with peers chose to address numbers 1, 3, 4, 5, and 9. Patient was additionally required to chose a job that served the community. Patient chose Occupational Therapist because you can teach people new ways of doing old things. Patient actively participated in group discussion. Patient stated that decision making is an important part of remaining sober.   Marykay Lex Akshaj Besancon, LRT/CTRS   Dewitte Vannice L 06/14/2012 4:05 PM

## 2012-06-15 NOTE — Tx Team (Signed)
Interdisciplinary Treatment Plan Update (Adult)  Date: 06/15/2012  Time Reviewed: 10:21 AM   Progress in Treatment: Attending groups: Yes Participating in groups: Yes Taking medication as prescribed:  Yes Tolerating medication:  Yes Family/Significant othe contact made: Not as yet Patient understands diagnosis: Yes Discussing patient identified problems/goals with staff: Yes Medical problems stabilized or resolved:  Yes Denies suicidal/homicidal ideation: Yes Patient has not harmed self or Others: Yes  New problem(s) identified: None Identified  Discharge Plan or Barriers: Patient will follow up at [facility] in [place], need to set appointment  Additional comments: N/A  Reason for Continuation of Hospitalization: Medication stabilization - patient has had past treatment for substance abuse but this is first time she has addressed bipolar issues.    Estimated length of stay: Discharge Friday   For review of initial/current patient goals, please see plan of care.  Attendees: Patient:     Family:     Physician:  Geoffery Lyons 06/15/2012 10:21 AM   Nursing:    06/15/2012 10:21 AM   Clinical Social Worker Ronda Fairly 06/15/2012 10:21 AM   Other:   06/15/2012 10:21 AM   Other:   06/15/2012 10:21 AM   Other:   06/15/2012 10:21 AM   Other:   06/15/2012 10:21 AM    Scribe for Treatment Team:   Carney Bern, LCSWA  06/15/2012 10:21 AM

## 2012-06-15 NOTE — Progress Notes (Signed)
Pt has been up in groups and interacting appropriately.  She did c/o of sleep difficulty last night during 15 minute checks.  Her depression rated a 2 and denies any hopelessness or anxiety.  She denies any symptoms of withdrawal. She is interested in taking the injection of Vivitrol to help ease her family's mind with her compliance.

## 2012-06-15 NOTE — Progress Notes (Signed)
[facility] LCSW Aftercare Discharge Planning Group Note  06/15/2012   Participation Quality:  Appropriate  Affect:  Appropriate  Cognitive: Appropriate  Insight: Good  Engagement in Group:  Engaged  Modes of Intervention:  Exploration, Clarification, Support and socialization  Summary of Progress/Problems: Patient reports she feels ready for discharge; agreed to continue inpatient stay in order to stabilize on medications for mood disorder which has been untreated for many years.  Patient will follow up at [facility] in [place], will need to schedule in late afternoons if possible.   Clide Dales 06/15/2012

## 2012-06-15 NOTE — Progress Notes (Signed)
Pt reports she is having a good day and feeling more hopeful.  She is not having any withdrawal symptoms.  She denies SI/HI/AV.  She voices no complaints.  She is looking to be discharged by Friday.  She is unsure if she will be going home or her parents.  She wants to go home to be close to her children, but is not sure yet if her husband will let her.  That makes her sad, but she understands his feelings.  Pt makes her needs known to staff.  Support/encouragement given.  Safety maintained with q15 minute checks.

## 2012-06-15 NOTE — Progress Notes (Signed)
[facility] LCSW Group Therapy  06/15/2012   Type of Therapy: Group Therapy 1:15 to 2:30 PM  Participation Level:   Appropriate  Participation Quality:  Appropriate  Affect:   Appropriate  Cognitive: Appropriate  Insight:  Developing  Engagement in Therapy:  Engaged  Modes of Intervention:   Warmup, exploration, clarification, problem solving and discussion  Summary of Progress/Problems: The focus of this group session was to process how we deal with difficult emotions and share with others the patterns that play out when we are reacting to the emotion verses the situation.  Janet Lewis shared that some of the most difficult emotions she deals with are fear, exhaustion and anger.  Once challenged she agrees that she avoids dealing with emotions.  She also shared how she uses her energy to do for others verses self care.    Janet Lewis 06/15/2012,

## 2012-06-15 NOTE — Plan of Care (Signed)
Problem: Ineffective individual coping Goal: STG: Pt will be able to identify effective and ineffective STG: Pt will be able to identify effective and ineffective coping patterns  Outcome: Progressing Patient attending and participating in groups  Problem: Alteration in mood & ability to function due to Goal: LTG-Patient demonstrates decreased signs of withdrawal (Patient demonstrates decreased signs of withdrawal to the point the patient is safe to return home and continue treatment in an outpatient setting)  Outcome: Not Applicable Date Met:  06/15/12 Patient did not require detox; here for medication stabilization for mood disorder

## 2012-06-15 NOTE — Progress Notes (Signed)
[facility] MD Progress Note  06/15/2012 12:42 PM Janet Lewis  MRN:  [id number] Subjective:  Janet Lewis continues to deal with the consequences of her drinking. Her husband continues to express anger towards her. She could go with her parents but she really wants to be home with her children. She wants to get back to work, get back to meetings, and do the things she needs to do to take care of herself. She states that the longest time without using alcohol as of lately has been one week. Admits she loses control over it. She hides it, obsesses about it and has put her drinking in front of her responsibilities. Does not blame the husband for feeling the way he does. She admits that she is not craving it as she did before. She is hopeful that between the mood stabilizer and the naltrexone and antabuse she can get some stability in her life Diagnosis:  Alcohol Dependence, Mood Disorder NOS  ADL's:  Intact  Sleep: Fair  Appetite:  Fair  Suicidal Ideation:  Plan:  Denies Intent:  Denies Means:  Denies Homicidal Ideation:  Plan:  Denies Intent:  Denies Means:  Denies AEB (as evidenced by):  Psychiatric Specialty Exam: Review of Systems  Constitutional: Negative.   HENT: Negative.   Eyes: Negative.   Respiratory: Negative.   Cardiovascular: Negative.   Gastrointestinal: Negative.   Genitourinary: Negative.   Musculoskeletal: Negative.   Skin: Negative.   Neurological: Negative.   Endo/Heme/Allergies: Negative.   Psychiatric/Behavioral: Positive for substance abuse. The patient is nervous/anxious.     Blood pressure 121/88, pulse 92, temperature 98.2 F (36.8 C), temperature source Oral, resp. rate 18, height 5\' 7"  (1.702 m), weight 81.647 kg (180 lb), last menstrual period 06/05/2012.Body mass index is 28.19 kg/(m^2).  General Appearance: Fairly Groomed  Patent attorney::  Fair  Speech:  Clear and Coherent  Volume:  Normal  Mood:  Anxious, Depressed and wooried, feeling shame and guilt   Affect:  Appropriate and Tearful  Thought Process:  Coherent and Goal Directed  Orientation:  Full (Time, Place, and Person)  Thought Content:  WDL and worries, concerns  Suicidal Thoughts:  No  Homicidal Thoughts:  No  Memory:  Immediate;   Fair Recent;   Fair Remote;   Fair  Judgement:  Intact  Insight:  Present  Psychomotor Activity:  Restlessness  Concentration:  Fair  Recall:  Fair  Akathisia:  No  Handed:  Right  AIMS (if indicated):     Assets:  Desire for Improvement Financial Resources/Insurance Vocational/Educational  Sleep:  Number of Hours: 6.25   Current Medications: Current Facility-Administered Medications  Medication Dose Route Frequency Provider Last Rate Last Dose  . acetaminophen (TYLENOL) tablet 650 mg  650 mg Oral Q6H PRN Rachael Fee, MD      . alum & mag hydroxide-simeth (MAALOX/MYLANTA) 200-200-20 MG/5ML suspension 30 mL  30 mL Oral Q4H PRN Rachael Fee, MD      . chlordiazePOXIDE (LIBRIUM) capsule 25 mg  25 mg Oral Q6H PRN Rachael Fee, MD      . hydrOXYzine (ATARAX/VISTARIL) tablet 25 mg  25 mg Oral Q6H PRN Rachael Fee, MD      . loperamide (IMODIUM) capsule 2-4 mg  2-4 mg Oral PRN Rachael Fee, MD      . magnesium hydroxide (MILK OF MAGNESIA) suspension 30 mL  30 mL Oral Daily PRN Rachael Fee, MD      . multivitamin with minerals tablet 1 tablet  1 tablet Oral Daily Rachael Fee, MD   1 tablet at 06/15/12 0802  . naltrexone (DEPADE) tablet 25 mg  25 mg Oral Daily Rachael Fee, MD   25 mg at 06/15/12 0802  . ondansetron (ZOFRAN-ODT) disintegrating tablet 4 mg  4 mg Oral Q6H PRN Rachael Fee, MD      . Oxcarbazepine (TRILEPTAL) tablet 300 mg  300 mg Oral BID Rachael Fee, MD   300 mg at 06/15/12 0802  . thiamine (B-1) injection 100 mg  100 mg Intramuscular Once Rachael Fee, MD      . thiamine (VITAMIN B-1) tablet 100 mg  100 mg Oral Daily Rachael Fee, MD   100 mg at 06/15/12 0802  . traZODone (DESYREL) tablet 50 mg  50 mg Oral QHS PRN  Rachael Fee, MD   50 mg at 06/14/12 2207    Lab Results: No results found for this or any previous visit (from the past 48 hour(s)).  Physical Findings: AIMS: Facial and Oral Movements Muscles of Facial Expression: None, normal Lips and Perioral Area: None, normal Jaw: None, normal Tongue: None, normal,Extremity Movements Upper (arms, wrists, hands, fingers): None, normal Lower (legs, knees, ankles, toes): None, normal, Trunk Movements Neck, shoulders, hips: None, normal, Overall Severity Severity of abnormal movements (highest score from questions above): None, normal Incapacitation due to abnormal movements: None, normal Patient's awareness of abnormal movements (rate only patient's report): No Awareness, Dental Status Current problems with teeth and/or dentures?: No Does patient usually wear dentures?: No  CIWA:  CIWA-Ar Total: 1 COWS:     Treatment Plan Summary: Daily contact with patient to assess and evaluate symptoms and progress in treatment Medication management  Plan: Supportive approach/coping skills/relapse prevention           Continue Naltrexone, start trial with Antabuse, consider Vivitrol           Still unstable mood.  Medical Decision Making Problem Points:  Review of psycho-social stressors (1) Data Points:  Review of medication regiment & side effects (2) Review of new medications or change in dosage (2)  I certify that inpatient services furnished can reasonably be expected to improve the patient's condition.   Janet Lewis 06/15/2012, 12:42 PM

## 2012-06-15 NOTE — Progress Notes (Signed)
Adult Psychoeducational Group Note  Date:  06/15/2012 Time:  6:30 PM  Group Topic/Focus:  Personal Choices and Values:   The focus of this group is to help patients assess and explore the importance of values in their lives, how their values affect their decisions, how they express their values and what opposes their expression.  Participation Level:  Active  Participation Quality:  Appropriate, Attentive and Sharing  Affect:  Appropriate  Cognitive:  Appropriate  Insight: Appropriate  Engagement in Group:  Engaged  Modes of Intervention:  Education, Dentist and Support  Additional Comments:  [name] attended group and shared. Patient shared what the negative and positive values are in her life. Patient then completed worksheet on identifying values and choosing a value orientated life. Patient expressed what values on the worksheet were important to her, and what values needed improvement and how it could be improved by setting a goal for each value.    Janet Lewis 06/15/2012, 6:30 PM

## 2012-06-16 MED ORDER — DISULFIRAM 250 MG PO TABS
250.0000 mg | ORAL_TABLET | Freq: Every day | ORAL | Status: DC
  Administered 2012-06-16 – 2012-06-17 (×2): 250 mg via ORAL
  Filled 2012-06-16 (×3): qty 1
  Filled 2012-06-16: qty 4
  Filled 2012-06-16: qty 1

## 2012-06-16 NOTE — Progress Notes (Signed)
Recreation Therapy Notes   Date: 02.20.2014  Time: 3:00pm      Group Topic/Focus: Leisure Education  Participation Level: Active  Participation Quality: Appropriate  Affect: Euthymic to Bright at times.   Cognitive: Appropriate   Additional Comments: Patient with peers played On Deck, a card game combining charades and pictionary. Patient successfully acted out or drew leisure and recreation activities for peers to guess. Patient participated in group discussion regarding using positive recreation and leisure as a coping mechanism post discharge. Patient smiled throughout session. Patient encouraged peers to participate in group activity.    Marykay Lex Mihail Prettyman, LRT/CTRS   Jearl Klinefelter 06/16/2012 3:48 PM

## 2012-06-16 NOTE — Progress Notes (Signed)
[facility] MD Progress Note  06/16/2012 2:40 PM Janet Lewis  MRN:  [id number] Subjective:  Janet Lewis is still very anxious anticipating what is going to happen once she gets out of here. She is hopeful that the medications for the cravings are going to be helping to have the cravings under control. She is concerned about when she gets out there. The cravings were that intense. We discussed the possibility of using Vivitrol so she does not have to depended on remembering to take a tablet. She is also willing to take the Antabuse. Her husband is putting as conditions for her to have the injection and take the Antabuse Diagnosis:  Alcohol Dependence, Mood Disorder NOS  ADL's:  Intact  Sleep: Poor  Appetite:  Fair  Suicidal Ideation:  Plan:  Denies Intent:  Denies Means:  Denies Homicidal Ideation:  Plan:  Denies Intent:  Denies Means:  Denies AEB (as evidenced by):  Psychiatric Specialty Exam: Review of Systems  Constitutional: Negative.   HENT: Negative.   Eyes: Negative.   Respiratory: Negative.   Cardiovascular: Negative.   Gastrointestinal: Negative.   Genitourinary: Negative.   Musculoskeletal: Negative.   Skin: Negative.   Neurological: Negative.   Endo/Heme/Allergies: Negative.   Psychiatric/Behavioral: Positive for substance abuse. The patient is nervous/anxious and has insomnia.     Blood pressure 106/79, pulse 81, temperature 97.8 F (36.6 C), temperature source Oral, resp. rate 16, height 5\' 7"  (1.702 m), weight 81.647 kg (180 lb), last menstrual period 06/05/2012.Body mass index is 28.19 kg/(m^2).  General Appearance: Fairly Groomed  Patent attorney::  Fair  Speech:  Clear and Coherent  Volume:  Normal  Mood:  Anxious and worried  Affect:  anxious  Thought Process:  Coherent and Goal Directed  Orientation:  Full (Time, Place, and Person)  Thought Content:  worries and concerns, fear of relapsing  Suicidal Thoughts:  No  Homicidal Thoughts:  No  Memory:  Immediate;    Fair Recent;   Fair Remote;   Fair  Judgement:  Fair  Insight:  Present  Psychomotor Activity:  Restlessness  Concentration:  Fair  Recall:  Fair  Akathisia:  No  Handed:  Right  AIMS (if indicated):     Assets:  Communication Skills Housing  Sleep:  Number of Hours: 6.75   Current Medications: Current Facility-Administered Medications  Medication Dose Route Frequency Provider Last Rate Last Dose  . acetaminophen (TYLENOL) tablet 650 mg  650 mg Oral Q6H PRN Rachael Fee, MD      . alum & mag hydroxide-simeth (MAALOX/MYLANTA) 200-200-20 MG/5ML suspension 30 mL  30 mL Oral Q4H PRN Rachael Fee, MD      . chlordiazePOXIDE (LIBRIUM) capsule 25 mg  25 mg Oral Q6H PRN Rachael Fee, MD      . hydrOXYzine (ATARAX/VISTARIL) tablet 25 mg  25 mg Oral Q6H PRN Rachael Fee, MD      . loperamide (IMODIUM) capsule 2-4 mg  2-4 mg Oral PRN Rachael Fee, MD      . magnesium hydroxide (MILK OF MAGNESIA) suspension 30 mL  30 mL Oral Daily PRN Rachael Fee, MD      . multivitamin with minerals tablet 1 tablet  1 tablet Oral Daily Rachael Fee, MD   1 tablet at 06/16/12 0753  . naltrexone (DEPADE) tablet 25 mg  25 mg Oral Daily Rachael Fee, MD   25 mg at 06/16/12 0753  . ondansetron (ZOFRAN-ODT) disintegrating tablet 4 mg  4 mg  Oral Q6H PRN Rachael Fee, MD      . Oxcarbazepine (TRILEPTAL) tablet 300 mg  300 mg Oral BID Rachael Fee, MD   300 mg at 06/16/12 0753  . thiamine (B-1) injection 100 mg  100 mg Intramuscular Once Rachael Fee, MD      . thiamine (VITAMIN B-1) tablet 100 mg  100 mg Oral Daily Rachael Fee, MD   100 mg at 06/16/12 0753  . traZODone (DESYREL) tablet 50 mg  50 mg Oral QHS PRN Rachael Fee, MD   50 mg at 06/15/12 2206    Lab Results: No results found for this or any previous visit (from the past 48 hour(s)).  Physical Findings: AIMS: Facial and Oral Movements Muscles of Facial Expression: None, normal Lips and Perioral Area: None, normal Jaw: None, normal Tongue:  None, normal,Extremity Movements Upper (arms, wrists, hands, fingers): None, normal Lower (legs, knees, ankles, toes): None, normal, Trunk Movements Neck, shoulders, hips: None, normal, Overall Severity Severity of abnormal movements (highest score from questions above): None, normal Incapacitation due to abnormal movements: None, normal Patient's awareness of abnormal movements (rate only patient's report): No Awareness, Dental Status Current problems with teeth and/or dentures?: No Does patient usually wear dentures?: No  CIWA:  CIWA-Ar Total: 1 COWS:     Treatment Plan Summary: Daily contact with patient to assess and evaluate symptoms and progress in treatment Medication management  Plan: Supportive approach/coping skills/relapse prevention           Complete the detox           Continue naltrexone 50 mg daily           Start Antabuse 250 mg daily  Medical Decision Making Problem Points:  Review of last therapy session (1) and Review of psycho-social stressors (1) Data Points:  Review of medication regiment & side effects (2) Review of new medications or change in dosage (2)  I certify that inpatient services furnished can reasonably be expected to improve the patient's condition.   Janet Lewis A 06/16/2012, 2:40 PM

## 2012-06-16 NOTE — Clinical Social Work Note (Signed)
[facility] Group Notes:  (Counselor/Nursing/MHT/Case Management/Adjunct)  06/16/2012 2:41 PM   Type of Therapy:  Group Therapy  Participation Level:  Active  Participation Quality:  Appropriate  Affect:  Appropriate  Cognitive:  Appropriate  Insight:  Engaged  Engagement in Group:  Engaged  Engagement in Therapy:  Engaged  Modes of Intervention:  Activity, Confrontation, Problem-solving, Socialization and Support  Summary of Progress/Problems: Topic-Balance: The topic for group was balance in life. Pt participated in the discussion about when their life was in balance and out of balance and how this feels. Pt discussed ways to get back in balance and short term goals they can work on to get where they want to be. Sanaa talked about how alcohol, inconsistency, and indecision often cause her to feel out of balance. She described ways to achieve balance that include taking advantage of social supports (husband) when necessary. Kyli participated in the [place] where she talked about a special time in her life. She said that the birth of her third child was very special to her because she knew what to expect by the third child, thus making the experience more enjoyable.     Jameriah Trotti N 03/10/2012, 2:34 PM

## 2012-06-16 NOTE — Progress Notes (Signed)
[facility] LCSW Aftercare Discharge Planning Group Note  06/16/2012 9:37 AM  Participation Quality:  Appropriate  Affect:  Appropriate  Cognitive:  Appropriate  Insight:  Engaged  Engagement in Group:  Engaged  Modes of Intervention:  Clarification, Discussion, Problem-solving, Socialization and Support  Summary of Progress/Problems: Pt was pleasant and cooperative during group. She rated depression as 0, anxiety at 1, hoplessness at 0, and currently has no HI/SI. She signed consent for SW to contact husband for suicide prevention [phone]) [phone] and expressed her readiness to discharge. She would also like to talk to the doctor about receiving one of her medications in shot form.   Andres Shad, MSW Clinical Lead [phone]  Smart, Ledell Peoples 06/16/2012, 9:37 AM

## 2012-06-16 NOTE — Progress Notes (Signed)
  D) Patient pleasant and cooperative upon my assessment. Patient brightens when discussing plans for discharge, states "I would be happy to go home today or tomorrow, I can't wait to see my kids." Patient completed Patient Self Inventory, reports slept "fair," and  appetite is "good." Patient rates depression as   1/10, patient rates hopeless feelings as 0 /10. Patient denies SI/HI, denies A/V hallucinations.   A) Patient offered support and encouragement, patient encouraged to discuss feelings/concerns with staff. Patient verbalized understanding. Patient monitored Q15 minutes for safety. Patient met with MD  to discuss today's goals and plan of care.  R) Patient visible in milieu, attending groups in day room and meals in dining room. Patient appropriate with staff and peers.   Patient taking medications as ordered. Patient has a plan to "apply knowledge gained and attend meetings and peer to peer group." Will continue to monitor.

## 2012-06-16 NOTE — Progress Notes (Signed)
[facility] INPATIENT:  Family/Significant Other Suicide Prevention Education  Suicide Prevention Education:  Education Completed;Billy Lozada (husband)has been identified by the patient as the family member/significant other with whom the patient will be residing, and identified as the person(s) who will aid the patient in the event of a mental health crisis (suicidal ideations/suicide attempt).  With written consent from the patient, the family member/significant other has been provided the following suicide prevention education, prior to the and/or following the discharge of the patient.  The suicide prevention education provided includes the following:  Suicide risk factors  Suicide prevention and interventions  National Suicide Hotline telephone number  [facility] assessment telephone number  [facility] Emergency Assistance 911  [facility] and/or Residential Mobile Crisis Unit telephone number  Request made of family/significant other to:  Remove weapons (e.g., guns, rifles, knives), all items previously/currently identified as safety concern.    Remove drugs/medications (over-the-counter, prescriptions, illicit drugs), all items previously/currently identified as a safety concern.  The family member/significant other verbalizes understanding of the suicide prevention education information provided.  The family member/significant other agrees to remove the items of safety concern listed above.  Seve Monette L 06/16/2012, 11:23 AM

## 2012-06-17 MED ORDER — DISULFIRAM 250 MG PO TABS: 250.0000 mg | ORAL_TABLET | Freq: Every day | ORAL | Status: DC

## 2012-06-17 MED ORDER — NALTREXONE HCL 50 MG PO TABS: 25.0000 mg | ORAL_TABLET | Freq: Every day | ORAL | Status: DC

## 2012-06-17 MED ORDER — OXCARBAZEPINE 300 MG PO TABS: 300.0000 mg | ORAL_TABLET | Freq: Two times a day (BID) | ORAL | Status: DC

## 2012-06-17 MED ORDER — TRAZODONE HCL 50 MG PO TABS: 50.0000 mg | ORAL_TABLET | Freq: Every evening | ORAL | Status: DC | PRN

## 2012-06-17 NOTE — [facility] Suicide Risk Assessment (Signed)
Suicide Risk Assessment  Discharge Assessment     Demographic Factors:  Caucasian  Mental Status Per Nursing Assessment::   On Admission:     Current Mental Status by Physician: In full contact with reality. There are no suicidal ideas, plans or intent. Her mood is euthymic her affect is appropriate. She is committed to abstinence.Will pursue outpatient follow up and Vivitrol   Loss Factors: NA  Historical Factors: NA  Risk Reduction Factors:   Responsible for children under 55 years of age, Sense of responsibility to family, Employed, Living with another person, especially a relative and Positive social support  Continued Clinical Symptoms:  Alcohol/Substance Abuse/Dependencies  Cognitive Features That Contribute To Risk: None identified   Suicide Risk:  Minimal: No identifiable suicidal ideation.  Patients presenting with no risk factors but with morbid ruminations; may be classified as minimal risk based on the severity of the depressive symptoms  Discharge Diagnoses: Alcohol Dependence, S/P Withdrawal, Mood Disorder NOS   AXIS I:   AXIS II:  Deferred AXIS III:   Past Medical History  Diagnosis Date  . Bipolar disorder    AXIS IV:  problems with primary support group AXIS V:  61-70 mild symptoms  Plan Of Care/Follow-up recommendations:  Activity:  AS tolerated Diet:  regular Will follow up, with Molli Hazard McMcillan/Vivitrol Is patient on multiple antipsychotic therapies at discharge:  No   Has Patient had three or more failed trials of antipsychotic monotherapy by history:  No  Recommended Plan for Multiple Antipsychotic Therapies: N/A   Ronith Berti A 06/17/2012, 1:00 PM

## 2012-06-17 NOTE — Progress Notes (Cosign Needed)
[employer] Adult Case Management Discharge Plan :  Will you be returning to the same living situation after discharge: Yes,  home with husband At discharge, do you have transportation home?:Yes,  husband Do you have the ability to pay for your medications:Yes,  [employer]  Release of information consent forms completed and in the chart;  Patient's signature needed at discharge.  Patient to Follow up at: Follow-up Information   Follow up with [facility]  On 06/20/2012. (Walk-in from 8am-9am Monday thru Friday for hospital follow-up)    Contact information:   [address] [place]  [address] [facility] [phone]      Follow up with [facility] On 06/21/2012. (Appt. with [facility] at 1:00PM )    Contact information:   [address] [place] [address] Phone( [phone]   Fax: [phone]         Patient denies SI/HI:   Yes,  yes    Safety Planning and Suicide Prevention discussed:  Yes,  discussed with pt's husband pt.   Obdulio Mash N 06/17/2012, 10:36 AM

## 2012-06-17 NOTE — Progress Notes (Signed)
Patient did attend the evening karaoke group. Pt was engaged, supportive, and participated by singing multiple songs with peer patients.

## 2012-06-17 NOTE — Progress Notes (Signed)
[facility] LCSW Aftercare Discharge Planning Group Note  06/17/2012 9:55 AM  Participation Quality:  Appropriate and Attentive  Affect:  Anxious and Appropriate  Cognitive:  Appropriate and Oriented  Insight:  Developing/Improving and Engaged  Engagement in Group:  Engaged  Modes of Intervention:  Discussion, Education, Problem-solving and Support  Summary of Progress/Problems: Pts affect was open, bright, and anxious during dc planning group. Pt states that she is "good, and excited about going home."  She rates depression at one and anxiety at four.  Pt does not endorse SI or HI.  She plans to go to follow up at [facility] after d/c.  Annisha Baar L 06/17/2012, 9:55 AM

## 2012-06-17 NOTE — Progress Notes (Signed)
Patient ID: Janet Lewis First, female   DOB: 06-May-1970, 43 y.o.   MRN: [id number] Patient discharged per physician order; patient denies SI/HI and A/V hallucinations; patient received samples, prescriptions, and the copy of AVS after it was reviewed; patient also had her insurance card returned; patient verbalized that she received all her belongings; patient left the unit ambulatory with mother

## 2012-06-17 NOTE — Discharge Summary (Signed)
Physician Discharge Summary Note  Patient:  Janet Lewis is an 42 y.o., female MRN:  [id number] DOB:  07/12/1970 Patient phone:  [phone] (home)  Patient address:   [address] [place] [address],   Date of Admission:  06/13/2012 Date of Discharge: 06/17/12  Reason for Admission:  Alcohol abuse  Discharge Diagnoses: Active Problems:   Alcohol dependence   Unspecified episodic mood disorder  Review of Systems  Constitutional: Negative.   HENT: Negative.   Eyes: Negative.   Respiratory: Negative.   Cardiovascular: Negative.   Gastrointestinal: Negative.   Genitourinary: Negative.   Musculoskeletal: Negative.   Skin: Negative.   Neurological: Negative.   Psychiatric/Behavioral: Positive for substance abuse (Alcoholism). Negative for depression, suicidal ideas, hallucinations and memory loss. The patient has insomnia (Stabilized with medication prior to discharge). The patient is not nervous/anxious.    Axis Diagnosis:   AXIS I:   Alcohol dependence, Unspecified episodic mood disorder AXIS II:  Deferred AXIS III:   Past Medical History  Diagnosis Date  . Bipolar disorder    AXIS IV:  other psychosocial or environmental problems and Alcoholism AXIS V:  64  Level of Care:  OP  Hospital Course: Back in March 2013 she went to [facility] 3 days for detox, then 28 days in [facility] .She relapsed a month after she left. The cravings were still there. She recognizes that she has a Bipolar Disorder. Started drinking to "stop the madness." Racing thoughts, recognizes that they have been going on for 15 years. Started drinking 6 years ago, and progressed rapidly. Had post partum first two pregnancies, given Zoloft 8 weeks. Found it helpful Seasonal component. Used to pick her skin.  After admission assessment and evaluation, it was determined that Janet Lewis will need detoxification treatment to stabilize her system of alcohol intoxication and to combat the  withdrawal symptoms of alcohol. And her discharge plans included a referral and appointments to outpatient psychiatric clinics for medication management, routine psychiatric care and counseling sessions. Janet Lewis was then started on Librium capules for her alcohol detoxification. She was also ordered and received disulfiram (Antabuse) 250 mg and Naltraxone (Depade) 25 mg for prevention of alcohol abuse. She received Trileptal 300 mg for mood stabilization and Trazodone 50 mg daily for sleep.  Janet Lewis was enrolled in group counseling sessions and activities to learn coping skills that should help her after discharge to cope better, manage her substance abuse problems to maintain a much longer sobriety. She was enrolled/attended AA/NA meetings being offered and held on this unit. She has no previous and or identifiable medical conditions that required treatment and or monitoring. However, she was monitored closely for any potential problems that may arise as a result of and or during detoxification treatment. Patient tolerated her treatment regimen and detoxification treatment without any significant adverse effects and or reactions reported.  Patient attended treatment team meeting this am and met with the team. Her reason for admission,  symptoms, substance abuse issues, response to treatment and discharge plans discussed. Patient endorsed that she is doing well and stable for discharge to pursue the next phase of her substance abuse treatment. She was encouraged to join/attend AA/NA meetings being offered and held within her community. She is instructed and encouraged to get a trusted sponsor from the advise of others or from whomever within the AA/NA meetings seems to make sense, and who has a proven track record, and will hold her responsible for her sobriety, and both expects and  insists on her total  abstinence from alcohol. She must focus the first of each month on the speaker meetings where she will  specifically look at how her life has been wrecked by drugs/alcohol and how her life has been similar to that of the speaker's life.   It was agreed upon between patient and the team that she will be discharged to follow-up care at [facility] here in [place] on 06/20/12 between the hours of 08:00 and 09:00 am, and at [facility] on 06/21/12 @ 1:00 pm. The addresses, dates and times for these appointments provided for patient.  Upon discharge, patient adamantly denies suicidal, homicidal ideations, auditory, visual hallucinations, delusional thinking and or withdrawal symptoms. Patient left [facility] with all personal belongings in no apparent distress. She received 4 days worth samples of her discharge medications. Transportation per family..   Consults:  None  Significant Diagnostic Studies:  labs: CBC with diff, CMP, UDS, Toxicology tests  Discharge Vitals:   Blood pressure 95/69, pulse 84, temperature 98.3 F (36.8 C), temperature source Oral, resp. rate 20, height 5\' 7"  (1.702 m), weight 81.647 kg (180 lb), last menstrual period 06/05/2012. Body mass index is 28.19 kg/(m^2). Lab Results:   No results found for this or any previous visit (from the past 72 hour(s)).  Physical Findings: AIMS: Facial and Oral Movements Muscles of Facial Expression: None, normal Lips and Perioral Area: None, normal Jaw: None, normal Tongue: None, normal,Extremity Movements Upper (arms, wrists, hands, fingers): None, normal Lower (legs, knees, ankles, toes): None, normal, Trunk Movements Neck, shoulders, hips: None, normal, Overall Severity Severity of abnormal movements (highest score from questions above): None, normal Incapacitation due to abnormal movements: None, normal Patient's awareness of abnormal movements (rate only patient's report): No Awareness, Dental Status Current problems with teeth and/or dentures?: No Does patient usually wear dentures?: No  CIWA:  CIWA-Ar Total: 1 COWS:      Psychiatric Specialty Exam: See Psychiatric Specialty Exam and Suicide Risk Assessment completed by Attending Physician prior to discharge.  Discharge destination:  Home  Is patient on multiple antipsychotic therapies at discharge:  No   Has Patient had three or more failed trials of antipsychotic monotherapy by history:  No  Recommended Plan for Multiple Antipsychotic Therapies: NA     Medication List    TAKE these medications     Indication   disulfiram 250 MG tablet  Commonly known as:  ANTABUSE  Take 1 tablet (250 mg total) by mouth daily. For prevention of alcohol abuse   Indication:  Excessive Use of Alcohol     naltrexone 50 MG tablet  Commonly known as:  DEPADE  Take 0.5 tablets (25 mg total) by mouth daily. For treatment of alcoholism   Indication:  Excessive Use of Alcohol     Oxcarbazepine 300 MG tablet  Commonly known as:  TRILEPTAL  Take 1 tablet (300 mg total) by mouth 2 (two) times daily. For mood stabilization   Indication:  Manic-Depression     traZODone 50 MG tablet  Commonly known as:  DESYREL  Take 1 tablet (50 mg total) by mouth at bedtime as needed for sleep. For depression/sleep   Indication:  Trouble Sleeping, Major Depressive Disorder       Follow-up Information   Follow up with [facility]  On 06/20/2012. (Walk-in from 8am-9am Monday thru Friday for hospital follow-up)    Contact information:   [address] [place]  [address] [facility] [phone] FAX [phone]  Follow up with [facility] On 06/21/2012. (Appt. with [facility] at 1:00PM )    Contact information:   [address] [place] [address] Phone( [phone]   Fax: [phone]        Follow-up recommendations:  Activity:  as tolerated Other:  Keep all scheduled follow-up appointments as recommended.  Comments:  Take all your medications as prescribed by your mental healthcare provider. Report any adverse effects and or reactions from  your medicines to your outpatient provider promptly. Patient is instructed and cautioned to not engage in alcohol and or illegal drug use while on prescription medicines. In the event of worsening symptoms, patient is instructed to call the crisis hotline, 911 and or go to the nearest ED for appropriate evaluation and treatment of symptoms. Follow-up with your primary care provider for your other medical issues, concerns and or health care needs.   Total Discharge Time:  Greater than minutes  Signed: Armandina Stammer I 06/17/2012, 11:54 AM

## 2012-06-17 NOTE — Progress Notes (Signed)
Adult Psychoeducational Group Note  Date:  06/17/2012 Time:  11:02 AM  Group Topic/Focus:  Relapse Prevention Planning:   The focus of this group is to define relapse and discuss the need for planning to combat relapse.  Participation Level:  Active  Participation Quality:  Attentive  Affect:  Appropriate  Cognitive:  Alert  Insight: Good  Engagement in Group:  Engaged  Modes of Intervention:  Discussion and Education  Additional Comments:  Pt is motivated for recovery and is looking forward to seeing family.  Irfan Veal T 06/17/2012, 11:02 AM

## 2012-06-17 NOTE — Progress Notes (Signed)
D. Pt has been up and has been interacting appropriately and attending various milieu activities this evening. Pt reports starting on Antabuse and has stated that she needs to stop using or she won't be able to come home. Pt reports not starting to use until in her thirties after her third child. Pt received medication this evening without incident, and has not verbalized any complaints. Pt denies any SI. A. Support and encouragement provided. R. Will continue to monitor.

## 2012-06-21 NOTE — Progress Notes (Signed)
Patient Discharge Instructions:  After Visit Summary (AVS):   Faxed to:  06/21/12 Discharge Summary Note:   Faxed to:  06/21/12 Psychiatric Admission Assessment Note:   Faxed to:  06/21/12 Suicide Risk Assessment - Discharge Assessment:   Faxed to:  06/21/12 Faxed/Sent to the Next Level Care provider:  06/21/12 Faxed to [facility] @ [phone] Faxed to [facility] @ [phone]  Jerelene Redden, 06/21/2012, 3:59 PM

## 2012-06-24 NOTE — Discharge Summary (Signed)
Agree with assessment and plan Shantoria Ellwood A. Kenyotta Dorfman, M.D. 

## 2014-03-28 ENCOUNTER — Encounter (HOSPITAL_COMMUNITY): Payer: Self-pay | Admitting: *Deleted

## 2014-03-28 ENCOUNTER — Emergency Department (HOSPITAL_COMMUNITY)
Admission: EM | Admit: 2014-03-28 | Discharge: 2014-03-28 | Disposition: A | Discharge status: Home / Self Care | Payer: BC Managed Care – PPO | Attending: Emergency Medicine | Admitting: Emergency Medicine

## 2014-03-28 DIAGNOSIS — Z3202 Encounter for pregnancy test, result negative: Secondary | ICD-10-CM | POA: Diagnosis not present

## 2014-03-28 DIAGNOSIS — R112 Nausea with vomiting, unspecified: Secondary | ICD-10-CM | POA: Diagnosis not present

## 2014-03-28 DIAGNOSIS — Z79899 Other long term (current) drug therapy: Secondary | ICD-10-CM | POA: Diagnosis not present

## 2014-03-28 DIAGNOSIS — R1084 Generalized abdominal pain: Secondary | ICD-10-CM | POA: Insufficient documentation

## 2014-03-28 DIAGNOSIS — F319 Bipolar disorder, unspecified: Secondary | ICD-10-CM | POA: Diagnosis not present

## 2014-03-28 DIAGNOSIS — R197 Diarrhea, unspecified: Secondary | ICD-10-CM | POA: Diagnosis not present

## 2014-03-28 DIAGNOSIS — R109 Unspecified abdominal pain: Secondary | ICD-10-CM | POA: Diagnosis present

## 2014-03-28 LAB — CBC WITH DIFFERENTIAL/PLATELET
BASOS PCT: 2 % — AB (ref 0–1)
Basophils Absolute: 0.1 10*3/uL (ref 0.0–0.1)
Eosinophils Absolute: 0 10*3/uL (ref 0.0–0.7)
Eosinophils Relative: 0 % (ref 0–5)
HCT: 43 % (ref 36.0–46.0)
Hemoglobin: 15.2 g/dL — ABNORMAL HIGH (ref 12.0–15.0)
Lymphocytes Relative: 23 % (ref 12–46)
Lymphs Abs: 1.2 10*3/uL (ref 0.7–4.0)
MCH: 34 pg (ref 26.0–34.0)
MCHC: 35.3 g/dL (ref 30.0–36.0)
MCV: 96.2 fL (ref 78.0–100.0)
MONO ABS: 0.3 10*3/uL (ref 0.1–1.0)
Monocytes Relative: 6 % (ref 3–12)
NEUTROS ABS: 3.6 10*3/uL (ref 1.7–7.7)
Neutrophils Relative %: 69 % (ref 43–77)
PLATELETS: 318 10*3/uL (ref 150–400)
RBC: 4.47 MIL/uL (ref 3.87–5.11)
RDW: 14.9 % (ref 11.5–15.5)
WBC: 5.2 10*3/uL (ref 4.0–10.5)

## 2014-03-28 LAB — URINALYSIS, ROUTINE W REFLEX MICROSCOPIC
Bilirubin Urine: NEGATIVE
Glucose, UA: NEGATIVE mg/dL
Hgb urine dipstick: NEGATIVE
Ketones, ur: 15 mg/dL — AB
Leukocytes, UA: NEGATIVE
Nitrite: NEGATIVE
Protein, ur: NEGATIVE mg/dL
Specific Gravity, Urine: 1.023 (ref 1.005–1.030)
Urobilinogen, UA: 1 mg/dL (ref 0.0–1.0)
pH: 7 (ref 5.0–8.0)

## 2014-03-28 LAB — COMPREHENSIVE METABOLIC PANEL
ALT: 13 U/L (ref 0–35)
AST: 29 U/L (ref 0–37)
Albumin: 4.1 g/dL (ref 3.5–5.2)
Alkaline Phosphatase: 64 U/L (ref 39–117)
Anion gap: 20 — ABNORMAL HIGH (ref 5–15)
BUN: 11 mg/dL (ref 6–23)
CO2: 23 mEq/L (ref 19–32)
Calcium: 8.8 mg/dL (ref 8.4–10.5)
Chloride: 101 mEq/L (ref 96–112)
Creatinine, Ser: 0.56 mg/dL (ref 0.50–1.10)
GFR calc Af Amer: >90 mL/min (ref >90)
GFR calc non Af Amer: >90 mL/min (ref >90)
Glucose, Bld: 94 mg/dL (ref 70–99)
Potassium: 3.9 mEq/L (ref 3.7–5.3)
Sodium: 144 mEq/L (ref 137–147)
Total Bilirubin: 0.4 mg/dL (ref 0.3–1.2)
Total Protein: 7.1 g/dL (ref 6.0–8.3)

## 2014-03-28 LAB — PREGNANCY, URINE: PREG TEST UR: NEGATIVE

## 2014-03-28 LAB — LIPASE, BLOOD: Lipase: 50 U/L (ref 11–59)

## 2014-03-28 MED ORDER — SODIUM CHLORIDE 0.9 % IV BOLUS (SEPSIS)
1000.0000 mL | Freq: Once | INTRAVENOUS | Status: AC
  Administered 2014-03-28: 1000 mL via INTRAVENOUS

## 2014-03-28 MED ORDER — DIPHENHYDRAMINE HCL 50 MG/ML IJ SOLN
25.0000 mg | Freq: Once | INTRAMUSCULAR | Status: AC
  Administered 2014-03-28: 25 mg via INTRAVENOUS
  Filled 2014-03-28: qty 1

## 2014-03-28 MED ORDER — PANTOPRAZOLE SODIUM 40 MG IV SOLR
40.0000 mg | Freq: Once | INTRAVENOUS | Status: AC
  Administered 2014-03-28: 40 mg via INTRAVENOUS
  Filled 2014-03-28: qty 40

## 2014-03-28 MED ORDER — SUCRALFATE 1 G PO TABS
1.0000 g | ORAL_TABLET | Freq: Once | ORAL | Status: AC
  Administered 2014-03-28: 1 g via ORAL
  Filled 2014-03-28: qty 1

## 2014-03-28 MED ORDER — ONDANSETRON HCL 4 MG PO TABS: 4.0000 mg | ORAL_TABLET | Freq: Four times a day (QID) | ORAL | Status: DC

## 2014-03-28 MED ORDER — PANTOPRAZOLE SODIUM 20 MG PO TBEC: 20.0000 mg | DELAYED_RELEASE_TABLET | Freq: Every day | ORAL | Status: DC

## 2014-03-28 MED ORDER — SUCRALFATE 1 G PO TABS
1.0000 g | ORAL_TABLET | Freq: Two times a day (BID) | ORAL | Status: DC
Start: 2014-03-28 — End: 2014-04-07

## 2014-03-28 MED ORDER — METOCLOPRAMIDE HCL 5 MG/ML IJ SOLN
10.0000 mg | Freq: Once | INTRAMUSCULAR | Status: AC
  Administered 2014-03-28: 10 mg via INTRAVENOUS
  Filled 2014-03-28: qty 2

## 2014-03-28 NOTE — ED Notes (Signed)
The pt is c/o generalized abd pain for months.  She was sent here by her doctor for iv fluids.  Her doctor did not get lab work or xrays there  [place] one month

## 2014-03-28 NOTE — Discharge Instructions (Signed)
Please follow the directions provided.  Be sure to follow-up with your primary care provider and the GI doctor to for further management of your symptoms.  Take your medicines as directed.  Don't hesitate to return for new, worsening or concerning symptoms.     SEEK IMMEDIATE MEDICAL CARE IF:  Your pain does not go away within 2 hours.  You keep throwing up (vomiting).  Your pain is felt only in portions of the abdomen, such as the right side or the left lower portion of the abdomen.  You pass bloody or black tarry stools.

## 2014-03-28 NOTE — ED Notes (Signed)
Pt A&OX4, ambulatory at d/c with steady gait, NAD 

## 2014-03-28 NOTE — ED Provider Notes (Signed)
CSN: [id number]     Arrival date & time 03/28/14  1617 History   First MD Initiated Contact with Patient 03/28/14 1704     Chief Complaint  Patient presents with  . Abdominal Pain   (Consider location/radiation/quality/duration/timing/severity/associated sxs/prior Treatment) HPI Janet Lewis is a 43 yo female presenting with report of recurrent and on-going abd pain x several months.  She is a recovering alcoholic but has recently binge drank vodka 3 nights ago.  She states she has several episodes of vomiting and diarrhea daily. She has been evaluated in the past by GI and had a endoscopy that shows esophagitis due to vomiting.  She was seen by her PCP today who suggested she come to the ED for IVF due to dehydration.  She denies any new changes to her abd pain, vomiting or diarrhea.  She was prescribed protonix but has not taken it because of all the other meds she is taking.  She describes her pain as sharp, generalized abd pain and rates it as 10/10.  She denies any dysuria or vaginal complaints, fevers, chills, bloody/bilious emesis or hematchezia or melena.   Past Medical History  Diagnosis Date  . Bipolar disorder    Past Surgical History  Procedure Laterality Date  . Breast surgery  2008    implants   No family history on file. History  Substance Use Topics  . Smoking status: Never Smoker   . Smokeless tobacco: Never Used  . Alcohol Use: 4.8 oz/week    8 Glasses of wine per week   OB History    No data available     Review of Systems  Constitutional: Negative for fever and chills.  HENT: Negative for sore throat.   Eyes: Negative for visual disturbance.  Respiratory: Negative for cough and shortness of breath.   Cardiovascular: Negative for chest pain and leg swelling.  Gastrointestinal: Positive for nausea, vomiting and diarrhea.  Genitourinary: Negative for dysuria.  Musculoskeletal: Negative for myalgias.  Skin: Negative for rash.  Neurological: Negative for  weakness, numbness and headaches.    Allergies  Sulfa antibiotics and Neosporin  Home Medications   Prior to Admission medications   Medication Sig Start Date End Date Taking? Authorizing Provider  disulfiram (ANTABUSE) 250 MG tablet Take 1 tablet (250 mg total) by mouth daily. For prevention of alcohol abuse 06/17/12   Encarnacion Slates, NP  naltrexone (DEPADE) 50 MG tablet Take 0.5 tablets (25 mg total) by mouth daily. For treatment of alcoholism 06/17/12   Encarnacion Slates, NP  Oxcarbazepine (TRILEPTAL) 300 MG tablet Take 1 tablet (300 mg total) by mouth 2 (two) times daily. For mood stabilization 06/17/12   Encarnacion Slates, NP  traZODone (DESYREL) 50 MG tablet Take 1 tablet (50 mg total) by mouth at bedtime as needed for sleep. For depression/sleep 06/17/12   Encarnacion Slates, NP   BP 134/88 mmHg  Pulse 86  Temp(Src) 97.9 F (36.6 C) (Oral)  Resp 16  SpO2 98%  LMP 02/26/2014 Physical Exam  Constitutional: She appears well-developed and well-nourished. No distress.  HENT:  Head: Normocephalic and atraumatic.  Mouth/Throat: Oropharynx is clear and moist. Mucous membranes are dry. No oropharyngeal exudate.  Eyes: Conjunctivae are normal.  Neck: Neck supple. No thyromegaly present.  Cardiovascular: Normal rate, regular rhythm and intact distal pulses.   Pulmonary/Chest: Effort normal and breath sounds normal. No respiratory distress. She has no wheezes. She has no rales. She exhibits no tenderness.  Abdominal: Soft. She exhibits no  distension and no mass. There is generalized tenderness. There is no rigidity, no rebound, no guarding, no CVA tenderness, no tenderness at McBurney's point and negative Murphy's sign.  Musculoskeletal: She exhibits no tenderness.  Lymphadenopathy:    She has no cervical adenopathy.  Neurological: She is alert.  Skin: Skin is warm and dry. No rash noted. She is not diaphoretic.  Psychiatric: She has a normal mood and affect.  Nursing note and vitals reviewed.   ED  Course  Procedures (including critical care time) Labs Review Labs Reviewed  CBC WITH DIFFERENTIAL - Abnormal; Notable for the following:    Hemoglobin 15.2 (*)    Basophils Relative 2 (*)    All other components within normal limits  COMPREHENSIVE METABOLIC PANEL - Abnormal; Notable for the following:    Anion gap 20 (*)    All other components within normal limits  URINALYSIS, ROUTINE W REFLEX MICROSCOPIC - Abnormal; Notable for the following:    Color, Urine AMBER (*)    Ketones, ur 15 (*)    All other components within normal limits  LIPASE, BLOOD  PREGNANCY, URINE    Imaging Review No results found.   EKG Interpretation None      MDM   Final diagnoses:  Generalized abdominal pain  Nausea vomiting and diarrhea   43 yo recovering alcoholic with chronic abd pain, n,v,d. Her labs are without significant abnormality except for anion gap: 20, consistent with dehydration.  There is no focal abdominal pain, no concern for appendicitis, cholecystitis, pancreatitis, ruptured viscus, UTI, kidney stone, or any other abdominal etiology. Fluid bolus given. Her vitals are stable, no fever. She reports improvement in symptoms after meds. She is  tolerating PO fluids > 6 oz.    Supportive therapy indicated with return if symptoms worsen.  Pt is well-appearing, in no acute distress and vital signs are stable.  They appear safe to be discharged.  Discharge include follow-up with their PCP.  Return precautions provided.    Filed Vitals:   03/28/14 1800 03/28/14 1830 03/28/14 1900 03/28/14 1919  BP: 130/73 121/76 121/70   Pulse: 89 92 96   Temp:    98.7 F (37.1 C)  TempSrc:    Oral  Resp: 24 15 18    SpO2: 96% 97% 97%    Meds given in ED:  Medications  sodium chloride 0.9 % bolus 1,000 mL (0 mLs Intravenous Stopped 03/28/14 1844)  metoCLOPramide (REGLAN) injection 10 mg (10 mg Intravenous Given 03/28/14 1745)  diphenhydrAMINE (BENADRYL) injection 25 mg (25 mg Intravenous Given  03/28/14 1746)  pantoprazole (PROTONIX) injection 40 mg (40 mg Intravenous Given 03/28/14 1752)  sucralfate (CARAFATE) tablet 1 g (1 g Oral Given 03/28/14 1752)  sodium chloride 0.9 % bolus 1,000 mL (0 mLs Intravenous Stopped 03/28/14 1933)    Discharge Medication List as of 03/28/2014  7:31 PM    START taking these medications   Details  ondansetron (ZOFRAN) 4 MG tablet Take 1 tablet (4 mg total) by mouth every 6 (six) hours., Starting 03/28/2014, Until Discontinued, Print    pantoprazole (PROTONIX) 20 MG tablet Take 1 tablet (20 mg total) by mouth daily., Starting 03/28/2014, Until Discontinued, Print    sucralfate (CARAFATE) 1 G tablet Take 1 tablet (1 g total) by mouth 2 (two) times daily., Starting 03/28/2014, Until Discontinued, Print           Britt Bottom, NP 03/31/14 1136  Tanna Furry, MD 04/07/14 [phone]

## 2014-04-06 ENCOUNTER — Emergency Department (HOSPITAL_COMMUNITY)
Admission: EM | Admit: 2014-04-06 | Discharge: 2014-04-07 | Disposition: A | Discharge status: Other Acute Inpatient Hospital | Payer: BC Managed Care – PPO | Attending: Emergency Medicine | Admitting: Emergency Medicine

## 2014-04-06 DIAGNOSIS — F1020 Alcohol dependence, uncomplicated: Secondary | ICD-10-CM | POA: Diagnosis present

## 2014-04-06 DIAGNOSIS — F1994 Other psychoactive substance use, unspecified with psychoactive substance-induced mood disorder: Secondary | ICD-10-CM

## 2014-04-06 DIAGNOSIS — F39 Unspecified mood [affective] disorder: Secondary | ICD-10-CM | POA: Diagnosis present

## 2014-04-06 DIAGNOSIS — F319 Bipolar disorder, unspecified: Secondary | ICD-10-CM | POA: Insufficient documentation

## 2014-04-06 DIAGNOSIS — Z79899 Other long term (current) drug therapy: Secondary | ICD-10-CM | POA: Diagnosis not present

## 2014-04-06 DIAGNOSIS — F10230 Alcohol dependence with withdrawal, uncomplicated: Secondary | ICD-10-CM

## 2014-04-06 DIAGNOSIS — F1024 Alcohol dependence with alcohol-induced mood disorder: Secondary | ICD-10-CM | POA: Insufficient documentation

## 2014-04-06 DIAGNOSIS — R45851 Suicidal ideations: Secondary | ICD-10-CM | POA: Diagnosis present

## 2014-04-06 LAB — COMPREHENSIVE METABOLIC PANEL
ALT: 11 U/L (ref 0–35)
AST: 27 U/L (ref 0–37)
Albumin: 3.7 g/dL (ref 3.5–5.2)
Alkaline Phosphatase: 56 U/L (ref 39–117)
Anion gap: 21 — ABNORMAL HIGH (ref 5–15)
BILIRUBIN TOTAL: 0.9 mg/dL (ref 0.3–1.2)
BUN: 10 mg/dL (ref 6–23)
CALCIUM: 8.7 mg/dL (ref 8.4–10.5)
CHLORIDE: 98 meq/L (ref 96–112)
CO2: 22 meq/L (ref 19–32)
Creatinine, Ser: 0.66 mg/dL (ref 0.50–1.10)
GFR calc Af Amer: >90 mL/min (ref >90)
Glucose, Bld: 96 mg/dL (ref 70–99)
Potassium: 3.8 mEq/L (ref 3.7–5.3)
SODIUM: 141 meq/L (ref 137–147)
Total Protein: 6.6 g/dL (ref 6.0–8.3)

## 2014-04-06 LAB — RAPID URINE DRUG SCREEN, HOSP PERFORMED
AMPHETAMINES: NOT DETECTED
Barbiturates: NOT DETECTED
Benzodiazepines: NOT DETECTED
Cocaine: NOT DETECTED
OPIATES: NOT DETECTED
TETRAHYDROCANNABINOL: NOT DETECTED

## 2014-04-06 LAB — CBC
HCT: 41.7 % (ref 36.0–46.0)
Hemoglobin: 14.3 g/dL (ref 12.0–15.0)
MCH: 33.6 pg (ref 26.0–34.0)
MCHC: 34.3 g/dL (ref 30.0–36.0)
MCV: 97.9 fL (ref 78.0–100.0)
Platelets: 206 10*3/uL (ref 150–400)
RBC: 4.26 MIL/uL (ref 3.87–5.11)
RDW: 15.6 % — ABNORMAL HIGH (ref 11.5–15.5)
WBC: 4.7 10*3/uL (ref 4.0–10.5)

## 2014-04-06 LAB — ETHANOL: Alcohol, Ethyl (B): 137 mg/dL — ABNORMAL HIGH (ref 0–11)

## 2014-04-06 MED ORDER — ONDANSETRON HCL 4 MG PO TABS: 4.0000 mg | ORAL_TABLET | Freq: Three times a day (TID) | ORAL | Status: DC | PRN

## 2014-04-06 MED ORDER — LORAZEPAM 1 MG PO TABS
1.0000 mg | ORAL_TABLET | Freq: Three times a day (TID) | ORAL | Status: DC | PRN
  Administered 2014-04-07: 1 mg via ORAL

## 2014-04-06 MED ORDER — ACETAMINOPHEN 325 MG PO TABS: 650.0000 mg | ORAL_TABLET | ORAL | Status: DC | PRN

## 2014-04-06 MED ORDER — NICOTINE 21 MG/24HR TD PT24: 21.0000 mg | MEDICATED_PATCH | Freq: Every day | TRANSDERMAL | Status: DC

## 2014-04-06 MED ORDER — IBUPROFEN 200 MG PO TABS: 600.0000 mg | ORAL_TABLET | Freq: Three times a day (TID) | ORAL | Status: DC | PRN

## 2014-04-06 MED ORDER — LORAZEPAM 1 MG PO TABS
0.0000 mg | ORAL_TABLET | Freq: Four times a day (QID) | ORAL | Status: DC
  Filled 2014-04-06: qty 1

## 2014-04-06 MED ORDER — OXCARBAZEPINE 300 MG PO TABS
600.0000 mg | ORAL_TABLET | Freq: Two times a day (BID) | ORAL | Status: DC
  Administered 2014-04-06 – 2014-04-07 (×2): 600 mg via ORAL
  Filled 2014-04-06 (×4): qty 2

## 2014-04-06 MED ORDER — LORAZEPAM 1 MG PO TABS: 0.0000 mg | ORAL_TABLET | Freq: Two times a day (BID) | ORAL | Status: DC

## 2014-04-06 MED ORDER — OXCARBAZEPINE 300 MG PO TABS: 300.0000 mg | ORAL_TABLET | Freq: Two times a day (BID) | ORAL | Status: DC

## 2014-04-06 MED ORDER — ONDANSETRON HCL 4 MG PO TABS
4.0000 mg | ORAL_TABLET | Freq: Four times a day (QID) | ORAL | Status: DC
  Administered 2014-04-06 – 2014-04-07 (×2): 4 mg via ORAL
  Filled 2014-04-06 (×2): qty 1

## 2014-04-06 MED ORDER — TRAZODONE HCL 50 MG PO TABS
50.0000 mg | ORAL_TABLET | Freq: Every evening | ORAL | Status: DC | PRN
  Administered 2014-04-06: 50 mg via ORAL
  Filled 2014-04-06: qty 1

## 2014-04-06 NOTE — ED Notes (Signed)
Pt has one bag pt belongings containing boots, pants, socks and shirt that is placed at triage nurse's station. Other belongings go home with family.

## 2014-04-06 NOTE — ED Notes (Signed)
Pt reports suicidal thoughts, no plan. Pt was fired from job on Tuesday and father had MI yesterday. Pt has hx of etoh abuse and has been drinking heavily recently. Had a pint of liquor at 1100 this am. Denies any other substance abuse. Hx of bipolar and detox from etoh.

## 2014-04-06 NOTE — ED Notes (Signed)
Patient transferred from main ED for depression and ETOH detox.  Patient states she has a history of bipolar but has not been taking her medicines.  States she starts drinking about 4 pm and goes to bed at 9, usually finishing about 1/2 of a 500 mL bottle.  Denies thoughts of suicide at this time, but states if she were to walk out of here she could not promise that she would be safe.  Husband in to visit and is very supportive and attentive.

## 2014-04-06 NOTE — ED Provider Notes (Signed)
CSN: [id number]     Arrival date & time 04/06/14  1534 History   First MD Initiated Contact with Patient 04/06/14 1640     Chief Complaint  Patient presents with  . Suicidal     (Consider location/radiation/quality/duration/timing/severity/associated sxs/prior Treatment) HPI Comments: Patient here complaining of suicidal ideations without a plan. Has had increased stress during the past week. Patient was fired from her job 3 days ago her father had a heart attack yesterday. She has a history of alcohol abuse and drinks about a height of liquor every day. Her last drink was today. Denies any other substance abuse history. History of bipolar disorder and has been noncompliant with her medications because she thought that she felt better. Denies any command hallucinations. Symptoms persistent and no treatment use prior to arrival. Nothing makes them better.  The history is provided by the patient and the spouse.    Past Medical History  Diagnosis Date  . Bipolar disorder    Past Surgical History  Procedure Laterality Date  . Breast surgery  2008    implants   No family history on file. History  Substance Use Topics  . Smoking status: Never Smoker   . Smokeless tobacco: Never Used  . Alcohol Use: 4.8 oz/week    8 Glasses of wine per week   OB History    No data available     Review of Systems  All other systems reviewed and are negative.     Allergies  Codeine; Sulfa antibiotics; and Neosporin  Home Medications   Prior to Admission medications   Medication Sig Start Date End Date Taking? Authorizing Provider  buPROPion (WELLBUTRIN XL) 300 MG 24 hr tablet Take 300 mg by mouth every morning. 03/14/14  Yes Historical Provider, MD  cloNIDine (CATAPRES) 0.1 MG tablet Take 0.1 mg by mouth at bedtime. 03/14/14  Yes Historical Provider, MD  LATUDA 80 MG TABS tablet Take 80 mg by mouth daily. 03/15/14  Yes Historical Provider, MD  naltrexone (DEPADE) 50 MG tablet Take 0.5  tablets (25 mg total) by mouth daily. For treatment of alcoholism Patient taking differently: Take 50 mg by mouth daily. For treatment of alcoholism 06/17/12  Yes Encarnacion Slates, NP  oxcarbazepine (TRILEPTAL) 600 MG tablet Take 600 mg by mouth 2 (two) times daily. 03/14/14  Yes Historical Provider, MD  pantoprazole (PROTONIX) 20 MG tablet Take 1 tablet (20 mg total) by mouth daily. 03/28/14  Yes Britt Bottom, NP  sucralfate (CARAFATE) 1 G tablet Take 1 tablet (1 g total) by mouth 2 (two) times daily. 03/28/14  Yes Britt Bottom, NP  traZODone (DESYREL) 100 MG tablet Take 100 mg by mouth at bedtime. 03/14/14  Yes Historical Provider, MD  ondansetron (ZOFRAN) 4 MG tablet Take 1 tablet (4 mg total) by mouth every 6 (six) hours. Patient not taking: Reported on 04/06/2014 03/28/14   Britt Bottom, NP  Oxcarbazepine (TRILEPTAL) 300 MG tablet Take 1 tablet (300 mg total) by mouth 2 (two) times daily. For mood stabilization Patient not taking: Reported on 04/06/2014 06/17/12   Encarnacion Slates, NP  traZODone (DESYREL) 50 MG tablet Take 1 tablet (50 mg total) by mouth at bedtime as needed for sleep. For depression/sleep Patient not taking: Reported on 04/06/2014 06/17/12   Encarnacion Slates, NP   BP 118/76 mmHg  Pulse 107  Temp(Src) 97.5 F (36.4 C) (Oral)  Resp 16  SpO2 98%  LMP 02/26/2014 Physical Exam  Constitutional: She is oriented to person, place, and  time. She appears well-developed and well-nourished.  Non-toxic appearance. No distress.  HENT:  Head: Normocephalic and atraumatic.  Eyes: Conjunctivae, EOM and lids are normal. Pupils are equal, round, and reactive to light.  Neck: Normal range of motion. Neck supple. No tracheal deviation present. No thyroid mass present.  Cardiovascular: Normal rate, regular rhythm and normal heart sounds.  Exam reveals no gallop.   No murmur heard. Pulmonary/Chest: Effort normal and breath sounds normal. No stridor. No respiratory distress. She has no  decreased breath sounds. She has no wheezes. She has no rhonchi. She has no rales.  Abdominal: Soft. Normal appearance and bowel sounds are normal. She exhibits no distension. There is no tenderness. There is no rebound and no CVA tenderness.  Musculoskeletal: Normal range of motion. She exhibits no edema or tenderness.  Neurological: She is alert and oriented to person, place, and time. She has normal strength. No cranial nerve deficit or sensory deficit. GCS eye subscore is 4. GCS verbal subscore is 5. GCS motor subscore is 6.  Skin: Skin is warm and dry. No abrasion and no rash noted.  Psychiatric: Her affect is blunt. Her speech is delayed. She is slowed. She expresses suicidal ideation. She expresses no suicidal plans.  Nursing note and vitals reviewed.   ED Course  Procedures (including critical care time) Labs Review Labs Reviewed  CBC - Abnormal; Notable for the following:    RDW 15.6 (*)    All other components within normal limits  URINE RAPID DRUG SCREEN (HOSP PERFORMED)  COMPREHENSIVE METABOLIC PANEL  ETHANOL    Imaging Review No results found.   EKG Interpretation None      MDM   Final diagnoses:  None    Patient is medically cleared and then evaluated by behavior health for final disposition.    Leota Jacobsen, MD 04/06/14 [phone]

## 2014-04-06 NOTE — ED Notes (Signed)
Patient husband, Lizbett Garciagarcia, would like to be notified if patient is to be moved.  [phone].  He has all belongings and will bring them back if she is to be transferred.

## 2014-04-06 NOTE — BH Assessment (Addendum)
Tele Assessment Note   Janet Lewis is an 43 y.o. femalewho came to [facility] seeking treatment for depression and ETOH abuse. 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 She is drinking 1/2 pt of vodka daily, with her last drink being this am at about noon, "a lot".  She also recently got a DUI.  She lives with her husband and three children.  Pt says she is depressed, not sleeping well, not able to concentrate or complete her work tasks,and that is why she was fired. She has lost 20 lbs in the past 2 months. She has had OP treatment in the past with Dr. Sabra Heck, but now sees Debbora Dus and [facility] off and on. She recently completed treatment at [facility] in [facility] and was sober for a month and a half.  She says she has been compliant with her psych meds only occasionally when her husband reminds her.  Pt denies SI, HI, A/V hallucinations, was cooperative in assessment with good eye contact, normal movement, and did not seen to be responding to internal stimuli. Pt had depressed affect, and says she has a great support system around her of friends and family.  Per Waylan Boga, seek IP placement for pt. Dr. Zenia Resides informed, and he agrees with disposition.  Axis I: Bipolar, Depressed Axis II: Deferred Axis III:  Past Medical History  Diagnosis Date  . Bipolar disorder    Axis IV: problems related to legal system/crime Axis V: 41-50 serious symptoms  Past Medical History:  Past Medical History  Diagnosis Date  . Bipolar disorder     Past Surgical History  Procedure Laterality Date  . Breast surgery  2008    implants    Family History: No family history on file.  Social History:  reports that she has never smoked. She has never used smokeless tobacco. She reports that she drinks about 4.8 oz of alcohol per week. She reports that she does not use illicit drugs.  Additional Social History:  Alcohol / Drug  Use Pain Medications: denies Prescriptions: denies Over the Counter: denies History of alcohol / drug use?: Yes Longest period of sobriety (when/how long): 45 days Negative Consequences of Use: Legal, Work / Youth worker, Personal relationships Withdrawal Symptoms:  (denies) Substance #1 Name of Substance 1: alcohol 1 - Age of First Use: teens 1 - Amount (size/oz): 1/2  pt vodka 1 - Frequency: daily 1 - Duration: 12 years 1 - Last Use / Amount: this am--"a lot"  CIWA: CIWA-Ar BP: 118/76 mmHg Pulse Rate: 107 COWS:    PATIENT STRENGTHS: (choose at least two) Ability for insight Active sense of humor Average or above average intelligence Capable of independent living Occupational psychologist fund of knowledge Motivation for treatment/growth Supportive family/friends Work skills  Allergies:  Allergies  Allergen Reactions  . Codeine Itching    Pt reports allergic reaction of itching to "all pain medications"  . Sulfa Antibiotics Hives  . Neosporin [Neomycin-Bacitracin Zn-Polymyx] Rash    Home Medications:  (Not in a hospital admission)  OB/GYN Status:  Patient's last menstrual period was 02/26/2014.  General Assessment Data Location of Assessment: [facility] Is this a Tele or Face-to-Face Assessment?: Face-to-Face Is this an Initial Assessment or a Re-assessment for this encounter?: Initial Assessment Living Arrangements: Spouse/significant other, Children Can pt return to current living arrangement?: Yes Admission Status: Voluntary Is patient capable of signing voluntary  admission?: Yes Transfer from: Home Referral Source: Self/Family/Friend     [place] Living Arrangements: Spouse/significant other, Children Name of Psychiatrist: leslie O neal Name of Therapist:  Jerolyn Shin)  Education Status Is patient currently in school?: No Highest grade of school patient has completed:  Engineer, structural)  Risk to self with the past  6 months Suicidal Ideation: No Suicidal Intent: No Is patient at risk for suicide?: No Suicidal Plan?: No Access to Means: Yes Specify Access to Suicidal Means:  (gunsin the home ) What has been your use of drugs/alcohol within the last 12 months?:  (saa SA section) Previous Attempts/Gestures: No Other Self Harm Risks:  (none known) Intentional Self Injurious Behavior: None Family Suicide History: Unknown Recent stressful life event(s): Job Loss, Legal Issues (DUI) Persecutory voices/beliefs?: No Depression: Yes Depression Symptoms: Despondent, Insomnia, Tearfulness, Isolating, Fatigue, Guilt, Loss of interest in usual pleasures, Feeling worthless/self pity, Feeling angry/irritable Substance abuse history and/or treatment for substance abuse?: Yes Suicide prevention information given to non-admitted patients: Not applicable  Risk to Others within the past 6 months Homicidal Ideation: No Thoughts of Harm to Others: No Current Homicidal Intent: No Current Homicidal Plan: No Access to Homicidal Means: No History of harm to others?: No Assessment of Violence: None Noted Does patient have access to weapons?: Yes (Comment) (guns in the home (hunting)) Criminal Charges Pending?: Yes Describe Pending Criminal Charges:  (DUI) Does patient have a court date: Yes Court Date:  (unsure)  Psychosis Hallucinations: None noted Delusions: None noted  Mental Status Report Appear/Hygiene: Disheveled, In scrubs Eye Contact: Good Motor Activity: Unremarkable Speech: Logical/coherent Level of Consciousness: Alert Mood: Depressed, Guilty, Ashamed/humiliated, Sad Affect: Depressed, Sad Anxiety Level: Moderate Thought Processes: Coherent, Relevant Judgement: Partial Orientation: Place, Person, Time, Appropriate for developmental age, Situation Obsessive Compulsive Thoughts/Behaviors: None  Cognitive Functioning Concentration: Decreased Memory: Recent Impaired, Remote Intact IQ:  Average Insight: Fair Impulse Control: Good Appetite: Poor Weight Loss: 20 Weight Gain: 0 Sleep: Decreased Total Hours of Sleep: 6 Vegetative Symptoms: Decreased grooming  ADLScreening [facility] Assessment Services) Patient's cognitive ability adequate to safely complete daily activities?: Yes Patient able to express need for assistance with ADLs?: No Independently performs ADLs?: Yes (appropriate for developmental age)  Prior Inpatient Therapy Prior Inpatient Therapy: Yes Prior Therapy Dates: Aug 2015 Prior Therapy Facilty/Provider(s): [facility] Reason for Treatment: ETOH  Prior Outpatient Therapy Prior Outpatient Therapy: Yes Prior Therapy Dates: unsure Prior Therapy Facilty/Provider(s): [place], Debbora Dus, Jerolyn Shin Reason for Treatment: ETOH  ADL Screening (condition at time of admission) Patient's cognitive ability adequate to safely complete daily activities?: Yes Is the patient deaf or have difficulty hearing?: No Does the patient have difficulty seeing, even when wearing glasses/contacts?: No Does the patient have difficulty concentrating, remembering, or making decisions?: No Patient able to express need for assistance with ADLs?: No Does the patient have difficulty dressing or bathing?: No Independently performs ADLs?: Yes (appropriate for developmental age) Does the patient have difficulty walking or climbing stairs?: No  Home Assistive Devices/Equipment Home Assistive Devices/Equipment: None    Abuse/Neglect Assessment (Assessment to be complete while patient is alone) Physical Abuse: Denies Verbal Abuse: Denies Sexual Abuse: Denies Exploitation of patient/patient's resources: Denies Self-Neglect: Denies     Regulatory affairs officer (For Healthcare) Does patient have an advance directive?: No Would patient like information on creating an advanced directive?: No - patient declined information    Additional Information 1:1 In Past 12 Months?: No CIRT  Risk: No Elopement Risk:  No Does patient have medical clearance?: Yes     Disposition:  Disposition Initial Assessment Completed for this Encounter: Yes Disposition of Patient: Inpatient treatment program Type of inpatient treatment program: Adult  Sheliah Hatch 04/06/2014 6:35 PM

## 2014-04-06 NOTE — ED Notes (Signed)
Patient transferred from main ED.  Requested a warm blanket.  Patient was oriented to the unit and to the visiting policies.  Verbalizes understanding.  Able to agree to seek out staff if feeling unsafe.

## 2014-04-06 NOTE — ED Notes (Signed)
Pt informed about medical clearance procedure, husband is taking jewelry home.  Pt is changing

## 2014-04-07 DIAGNOSIS — F1994 Other psychoactive substance use, unspecified with psychoactive substance-induced mood disorder: Secondary | ICD-10-CM

## 2014-04-07 DIAGNOSIS — R45851 Suicidal ideations: Secondary | ICD-10-CM

## 2014-04-07 MED ORDER — PANTOPRAZOLE SODIUM 20 MG PO TBEC: 20.0000 mg | DELAYED_RELEASE_TABLET | Freq: Every day | ORAL | Status: DC

## 2014-04-07 MED ORDER — SUCRALFATE 1 G PO TABS: 1.0000 g | ORAL_TABLET | Freq: Two times a day (BID) | ORAL | Status: DC

## 2014-04-07 MED ORDER — LURASIDONE HCL 80 MG PO TABS: 80.0000 mg | ORAL_TABLET | Freq: Every day | ORAL | Status: DC

## 2014-04-07 MED ORDER — TRAZODONE HCL 100 MG PO TABS: 100.0000 mg | ORAL_TABLET | Freq: Every day | ORAL | Status: DC

## 2014-04-07 MED ORDER — OXCARBAZEPINE 600 MG PO TABS: 600.0000 mg | ORAL_TABLET | Freq: Two times a day (BID) | ORAL | Status: DC

## 2014-04-07 MED ORDER — DIPHENHYDRAMINE HCL 25 MG PO CAPS: 25.0000 mg | ORAL_CAPSULE | Freq: Four times a day (QID) | ORAL | Status: DC | PRN

## 2014-04-07 MED ORDER — CLONIDINE HCL 0.1 MG PO TABS: 0.1000 mg | ORAL_TABLET | Freq: Every day | ORAL | Status: DC

## 2014-04-07 MED ORDER — BUPROPION HCL ER (XL) 300 MG PO TB24: 300.0000 mg | ORAL_TABLET | Freq: Every morning | ORAL | Status: DC

## 2014-04-07 NOTE — ED Notes (Signed)
Patient complains of increase anxiety. Respirations equal and unlabored, skin warm and dry. NAD. Patient will be medicated per MAR.

## 2014-04-07 NOTE — BH Assessment (Signed)
Lavell Luster, [facility] at [facility], confirmed adult unit is currently at capacity. Contacted the following facilities for placement:  BED AVAILABLE, FAXED CLINICAL INFORMATION: [facility], per [facility], per [facility], per [place]: [facility], per Archie Endo, per [facility], per [facility], per [facility], per [facility], per [facility], per [facility], per [facility], per [facility], per [facility], per [facility], per Cecille Rubin  NO RESPONSE: [facility]   [place], [place], [facility] Triage Specialist [phone]

## 2014-04-07 NOTE — BH Assessment (Signed)
Pamala Hurry from [facility] reports patient has been accepted by Urbano Heir, MD to the services of Dr. Launa Grill for inpatient treatment.  RN can call report only prior to patient's transfer at [phone].  Per Pamala Hurry patient must be transported prior to 11pm tonight or will need to come after 6am tomorrow.   Boyce Medici. MSW, LCSW Therapeutic Triage Services-Triage Specialist   Phone: [phone] Fax: [phone]

## 2014-04-07 NOTE — ED Provider Notes (Signed)
Evaluated patient regarding her rash.  Ongoing for months.  Seen by an urgent care and was treated for seborrhea.  No improvement with treatment.  On exam, erythematous rash with some excoriation on face, torso elbows, neck and scalp.  Will treat symptomatically.  Consider derm eval as an outpatient.  Doubt acute infection.  Dorie Rank, MD 04/07/14 [phone]

## 2014-04-07 NOTE — Consult Note (Signed)
[facility] Consult   Reason for Consult:  Suicidal thoughts in context of Alcohol use  Referring Physician:  EDP  Janet Lewis is an 43 y.o. female. Total Time spent with patient: 45 minutes  Assessment: AXIS I:  Substance Abuse and Substance Induced Mood Disorder AXIS II:  Deferred AXIS III:   Past Medical History  Diagnosis Date  . Bipolar disorder    AXIS IV:  occupational problems and problems related to social environment AXIS V:  41-50 serious symptoms  Plan:  Recommend psychiatric Inpatient admission when medically cleared.  Subjective:    Janet Lewis is a 43 y.o. female patient admitted with suicidal ideaiton wihtout plan.    HPI:  Patient reports long history of alcohol use and co/morbid bipolar disorder.  Patient endorses significant alcohol use escalating over the past few months. Patient was discharged from a 30 day recovery program but relapsed immediately and has not been able to maintain sobriety.  Patient endorses significant social stressors including being fired from her job due to absenteeism and drinking on the job.  Yesterday her father had a myocardial infarction and patient was too drunk to take her children to visit.  Patient describes this as her low point and relates that she wanted to die and felt that her family would be better off without her.  Patient UDS on presentation to the emergency department was 137. Patient has pending DUI.  Denies homicidal ideation, Denies AVH HPI Elements:   Location:  generalized. Quality:  acute. Severity:  severe. Timing:  ongoing. Duration:  acute exacerbation of a chronic problem. Context:  legal issues, loss of job, family medical crisis.  Past Psychiatric History: Past Medical History  Diagnosis Date  . Bipolar disorder     reports that she has never smoked. She has never used smokeless tobacco. She reports that she drinks about 4.8 oz of alcohol per week. She reports that she does not use illicit  drugs. No family history on file. Family History Substance Abuse: Yes, Describe: (uncle) Family Supports: Yes, List: (parents, husband) Living Arrangements: Spouse/significant other, Children Can pt return to current living arrangement?: Yes Abuse/Neglect [facility]) Physical Abuse: Denies Verbal Abuse: Denies Sexual Abuse: Denies Allergies:   Allergies  Allergen Reactions  . Codeine Itching    Pt reports allergic reaction of itching to "all pain medications"  . Sulfa Antibiotics Hives  . Neosporin [Neomycin-Bacitracin Zn-Polymyx] Rash    ACT Assessment Complete:  Yes:    Educational Status    Risk to Self: Risk to self with the past 6 months Suicidal Ideation: No Suicidal Intent: No Is patient at risk for suicide?: No Suicidal Plan?: No Access to Means: Yes Specify Access to Suicidal Means:  (gunsin the home ) What has been your use of drugs/alcohol within the last 12 months?:  (saa SA section) Previous Attempts/Gestures: No Other Self Harm Risks:  (none known) Intentional Self Injurious Behavior: None Family Suicide History: Unknown Recent stressful life event(s): Job Loss, Legal Issues (DUI) Persecutory voices/beliefs?: No Depression: Yes Depression Symptoms: Despondent, Insomnia, Tearfulness, Isolating, Fatigue, Guilt, Loss of interest in usual pleasures, Feeling worthless/self pity, Feeling angry/irritable Substance abuse history and/or treatment for substance abuse?: Yes Suicide prevention information given to non-admitted patients: Not applicable  Risk to Others: Risk to Others within the past 6 months Homicidal Ideation: No Thoughts of Harm to Others: No Current Homicidal Intent: No Current Homicidal Plan: No Access to Homicidal Means: No History of harm to others?: No Assessment of Violence: None  Noted Does patient have access to weapons?: Yes (Comment) (guns in the home (hunting)) Criminal Charges Pending?: Yes Describe Pending Criminal Charges:  (DUI) Does  patient have a court date: Yes Court Date:  (unsure)  Abuse: Abuse/Neglect Assessment (Assessment to be complete while patient is alone) Physical Abuse: Denies Verbal Abuse: Denies Sexual Abuse: Denies Exploitation of patient/patient's resources: Denies Self-Neglect: Denies  Prior Inpatient Therapy: Prior Inpatient Therapy Prior Inpatient Therapy: Yes Prior Therapy Dates: Aug 2015 Prior Therapy Facilty/Provider(s): [facility] Reason for Treatment: ETOH  Prior Outpatient Therapy: Prior Outpatient Therapy Prior Outpatient Therapy: Yes Prior Therapy Dates: unsure Prior Therapy Facilty/Provider(s): [place], Debbora Dus, Jerolyn Shin Reason for Treatment: ETOH  Additional Information: Additional Information 1:1 In Past 12 Months?: No CIRT Risk: No Elopement Risk: No Does patient have medical clearance?: Yes                  Objective: Blood pressure 132/65, pulse 98, temperature 97.6 F (36.4 C), temperature source Oral, resp. rate 18, last menstrual period 02/26/2014, SpO2 98 %.There is no weight on file to calculate BMI. Results for orders placed or performed during the hospital encounter of 04/06/14 (from the past 72 hour(s))  Urine Drug Screen     Status: None   Collection Time: 04/06/14  4:02 PM  Result Value Ref Range   Opiates NONE DETECTED NONE DETECTED   Cocaine NONE DETECTED NONE DETECTED   Benzodiazepines NONE DETECTED NONE DETECTED   Amphetamines NONE DETECTED NONE DETECTED   Tetrahydrocannabinol NONE DETECTED NONE DETECTED   Barbiturates NONE DETECTED NONE DETECTED    Comment:        DRUG SCREEN FOR MEDICAL PURPOSES ONLY.  IF CONFIRMATION IS NEEDED FOR ANY PURPOSE, NOTIFY LAB WITHIN 5 DAYS.        LOWEST DETECTABLE LIMITS FOR URINE DRUG SCREEN Drug Class       Cutoff (ng/mL) Amphetamine      1000 Barbiturate      200 Benzodiazepine   737 Tricyclics       106 Opiates          300 Cocaine          300 THC              50   CBC      Status: Abnormal   Collection Time: 04/06/14  4:23 PM  Result Value Ref Range   WBC 4.7 4.0 - 10.5 K/uL   RBC 4.26 3.87 - 5.11 MIL/uL   Hemoglobin 14.3 12.0 - 15.0 g/dL   HCT 41.7 36.0 - 46.0 %   MCV 97.9 78.0 - 100.0 fL   MCH 33.6 26.0 - 34.0 pg   MCHC 34.3 30.0 - 36.0 g/dL   RDW 15.6 (H) 11.5 - 15.5 %   Platelets 206 150 - 400 K/uL  Comprehensive metabolic panel     Status: Abnormal   Collection Time: 04/06/14  4:23 PM  Result Value Ref Range   Sodium 141 137 - 147 mEq/L   Potassium 3.8 3.7 - 5.3 mEq/L   Chloride 98 96 - 112 mEq/L   CO2 22 19 - 32 mEq/L   Glucose, Bld 96 70 - 99 mg/dL   BUN 10 6 - 23 mg/dL   Creatinine, Ser 0.66 0.50 - 1.10 mg/dL   Calcium 8.7 8.4 - 10.5 mg/dL   Total Protein 6.6 6.0 - 8.3 g/dL   Albumin 3.7 3.5 - 5.2 g/dL   AST 27 0 - 37 U/L  ALT 11 0 - 35 U/L   Alkaline Phosphatase 56 39 - 117 U/L   Total Bilirubin 0.9 0.3 - 1.2 mg/dL   GFR calc non Af Amer >90 >90 mL/min   GFR calc Af Amer >90 >90 mL/min    Comment: (NOTE) The eGFR has been calculated using the CKD EPI equation. This calculation has not been validated in all clinical situations. eGFR's persistently <90 mL/min signify possible Chronic Kidney Disease.    Anion gap 21 (H) 5 - 15  Ethanol (ETOH)     Status: Abnormal   Collection Time: 04/06/14  4:23 PM  Result Value Ref Range   Alcohol, Ethyl (B) 137 (H) 0 - 11 mg/dL    Comment:        LOWEST DETECTABLE LIMIT FOR SERUM ALCOHOL IS 11 mg/dL FOR MEDICAL PURPOSES ONLY    Labs are reviewed and are pertinent for medical issues being treated .  Current Facility-Administered Medications  Medication Dose Route Frequency Provider Last Rate Last Dose  . acetaminophen (TYLENOL) tablet 650 mg  650 mg Oral Q4H PRN Leota Jacobsen, MD      . diphenhydrAMINE (BENADRYL) capsule 25 mg  25 mg Oral Q6H PRN Dorie Rank, MD      . ibuprofen (ADVIL,MOTRIN) tablet 600 mg  600 mg Oral Q8H PRN Leota Jacobsen, MD      . LORazepam (ATIVAN) tablet 0-4 mg   0-4 mg Oral 4 times per day Leota Jacobsen, MD   0 mg at 04/06/14 1956   Followed by  . [START ON 04/08/2014] LORazepam (ATIVAN) tablet 0-4 mg  0-4 mg Oral Q12H Leota Jacobsen, MD      . LORazepam (ATIVAN) tablet 1 mg  1 mg Oral Q8H PRN Leota Jacobsen, MD   1 mg at 04/07/14 [phone]  . ondansetron (ZOFRAN) tablet 4 mg  4 mg Oral Q8H PRN Leota Jacobsen, MD      . ondansetron [facility]) tablet 4 mg  4 mg Oral Q6H Leota Jacobsen, MD   4 mg at 04/07/14 1042  . Oxcarbazepine (TRILEPTAL) tablet 600 mg  600 mg Oral BID Leota Jacobsen, MD   600 mg at 04/07/14 1042  . traZODone (DESYREL) tablet 50 mg  50 mg Oral QHS PRN Leota Jacobsen, MD   50 mg at 04/06/14 2234   Current Outpatient Prescriptions  Medication Sig Dispense Refill  . naltrexone (DEPADE) 50 MG tablet Take 0.5 tablets (25 mg total) by mouth daily. For treatment of alcoholism (Patient taking differently: Take 50 mg by mouth daily. For treatment of alcoholism) 30 tablet 0  . buPROPion (WELLBUTRIN XL) 300 MG 24 hr tablet Take 1 tablet (300 mg total) by mouth every morning.  1  . cloNIDine (CATAPRES) 0.1 MG tablet Take 1 tablet (0.1 mg total) by mouth at bedtime. 60 tablet 1  . diphenhydrAMINE (BENADRYL) 25 mg capsule Take 1 capsule (25 mg total) by mouth every 6 (six) hours as needed for itching. 30 capsule 0  . lurasidone (LATUDA) 80 MG TABS tablet Take 1 tablet (80 mg total) by mouth daily. 30 tablet 1  . Oxcarbazepine (TRILEPTAL) 600 MG tablet Take 1 tablet (600 mg total) by mouth 2 (two) times daily.    . pantoprazole (PROTONIX) 20 MG tablet Take 1 tablet (20 mg total) by mouth daily. 30 tablet 1  . sucralfate (CARAFATE) 1 G tablet Take 1 tablet (1 g total) by mouth 2 (two) times daily. 60 tablet 0  .  traZODone (DESYREL) 100 MG tablet Take 1 tablet (100 mg total) by mouth at bedtime.  1    Psychiatric Specialty Exam:     Blood pressure 132/65, pulse 98, temperature 97.6 F (36.4 C), temperature source Oral, resp. rate 18, last  menstrual period 02/26/2014, SpO2 98 %.There is no weight on file to calculate BMI.  General Appearance: Casual  Eye Contact::  Minimal  Speech:  Clear and Coherent  Volume:  Normal  Mood:  Anxious and Depressed  Affect:  Congruent  Thought Process:  Coherent, Goal Directed and Logical  Orientation:  Full (Time, Place, and Person)  Thought Content:  Rumination  Suicidal Thoughts:  Yes.  without intent/plan  Homicidal Thoughts:  No  Memory:  Immediate;   Good Recent;   Good Remote;   Good  Judgement:  Fair  Insight:  Fair  Psychomotor Activity:  Normal  Concentration:  Good  Recall:  Good  Fund of Knowledge:Good  Language: Good  Akathisia:  No  Handed:  Right  AIMS (if indicated):     Assets:  Communication Skills Desire for Improvement Housing Social Support  Sleep:      Musculoskeletal: Strength & Muscle Tone: within normal limits Gait & Station: normal Patient leans: N/A  Treatment Plan Summary: patient transferred to [facility] for stabilization of mood / alcohol detox   Kennedy Bucker PMH-NP  04/07/2014 5:23 PM   Patient seen during morning rounds, chart reviewed, case discussed with treatment team and physician extender, developed treatment plan. Reviewed the information documented and agree with the treatment plan.  Bretton Tandy,JANARDHAHA R. 04/08/2014 12:43 PM

## 2014-04-07 NOTE — BH Assessment (Addendum)
[place] Assessment Progress Note   The following facilities were contacted in an attempt to place the pt:  Referral Faxed for Review: Beds Available- [facility] per [facility] per Bea Graff per [facility] per [facility] per [facility]if[facility] Gore per Christ[facility] Faith per [place] per [place] per Carlyn Reichert per [facility] per [facility] per Deneise Lever, but referral faxed for possible wait list   At Capacity: [place] per Danny Lawless per [facility] per Cam Hai per [place] per Hassan Rowan  No answer/left message: [facility] at [phone] at [place] [facility] [place] declined:  [facility] per [place] due to [facility] placement recommended at [phone]  TTS will continue to seek placement for the pt.  Shaune Pascal, MS, [facility] Licensed Professional Counselor Therapeutic Triage Specialist [facility] Phone: [phone] Fax: [phone]

## 2014-06-20 ENCOUNTER — Inpatient Hospital Stay (HOSPITAL_COMMUNITY): Payer: BLUE CROSS/BLUE SHIELD

## 2014-06-20 ENCOUNTER — Encounter (HOSPITAL_COMMUNITY): Payer: Self-pay | Admitting: Neurology

## 2014-06-20 ENCOUNTER — Emergency Department (HOSPITAL_COMMUNITY): Payer: BLUE CROSS/BLUE SHIELD

## 2014-06-20 ENCOUNTER — Inpatient Hospital Stay (HOSPITAL_COMMUNITY)
Admission: EM | Admit: 2014-06-20 | Discharge: 2014-06-24 | DRG: 918 | Disposition: A | Discharge status: Other Acute Inpatient Hospital | Payer: BLUE CROSS/BLUE SHIELD | Attending: Internal Medicine | Admitting: Internal Medicine

## 2014-06-20 DIAGNOSIS — F10230 Alcohol dependence with withdrawal, uncomplicated: Secondary | ICD-10-CM

## 2014-06-20 DIAGNOSIS — G253 Myoclonus: Secondary | ICD-10-CM | POA: Diagnosis present

## 2014-06-20 DIAGNOSIS — K219 Gastro-esophageal reflux disease without esophagitis: Secondary | ICD-10-CM | POA: Diagnosis present

## 2014-06-20 DIAGNOSIS — T421X1A Poisoning by iminostilbenes, accidental (unintentional), initial encounter: Secondary | ICD-10-CM | POA: Diagnosis not present

## 2014-06-20 DIAGNOSIS — Z87891 Personal history of nicotine dependence: Secondary | ICD-10-CM

## 2014-06-20 DIAGNOSIS — F1020 Alcohol dependence, uncomplicated: Secondary | ICD-10-CM | POA: Diagnosis present

## 2014-06-20 DIAGNOSIS — Z9119 Patient's noncompliance with other medical treatment and regimen: Secondary | ICD-10-CM | POA: Diagnosis present

## 2014-06-20 DIAGNOSIS — T43212A Poisoning by selective serotonin and norepinephrine reuptake inhibitors, intentional self-harm, initial encounter: Secondary | ICD-10-CM | POA: Diagnosis not present

## 2014-06-20 DIAGNOSIS — T421X2D Poisoning by iminostilbenes, intentional self-harm, subsequent encounter: Secondary | ICD-10-CM | POA: Diagnosis not present

## 2014-06-20 DIAGNOSIS — T50901A Poisoning by unspecified drugs, medicaments and biological substances, accidental (unintentional), initial encounter: Secondary | ICD-10-CM | POA: Diagnosis present

## 2014-06-20 DIAGNOSIS — F314 Bipolar disorder, current episode depressed, severe, without psychotic features: Secondary | ICD-10-CM | POA: Diagnosis not present

## 2014-06-20 DIAGNOSIS — F319 Bipolar disorder, unspecified: Secondary | ICD-10-CM | POA: Diagnosis present

## 2014-06-20 DIAGNOSIS — T50902A Poisoning by unspecified drugs, medicaments and biological substances, intentional self-harm, initial encounter: Secondary | ICD-10-CM | POA: Diagnosis not present

## 2014-06-20 DIAGNOSIS — F39 Unspecified mood [affective] disorder: Secondary | ICD-10-CM | POA: Diagnosis not present

## 2014-06-20 DIAGNOSIS — T421X2A Poisoning by iminostilbenes, intentional self-harm, initial encounter: Secondary | ICD-10-CM | POA: Diagnosis not present

## 2014-06-20 DIAGNOSIS — T1491 Suicide attempt: Secondary | ICD-10-CM | POA: Diagnosis not present

## 2014-06-20 DIAGNOSIS — F310 Bipolar disorder, current episode hypomanic: Secondary | ICD-10-CM | POA: Diagnosis not present

## 2014-06-20 DIAGNOSIS — F10220 Alcohol dependence with intoxication, uncomplicated: Secondary | ICD-10-CM | POA: Diagnosis not present

## 2014-06-20 DIAGNOSIS — F1010 Alcohol abuse, uncomplicated: Secondary | ICD-10-CM | POA: Insufficient documentation

## 2014-06-20 HISTORY — DX: Anxiety disorder, unspecified: F41.9

## 2014-06-20 LAB — CBC WITH DIFFERENTIAL/PLATELET
BASOS PCT: 0 % (ref 0–1)
Basophils Absolute: 0 10*3/uL (ref 0.0–0.1)
Eosinophils Absolute: 0 10*3/uL (ref 0.0–0.7)
Eosinophils Relative: 0 % (ref 0–5)
HEMATOCRIT: 36 % (ref 36.0–46.0)
Hemoglobin: 11.9 g/dL — ABNORMAL LOW (ref 12.0–15.0)
LYMPHS ABS: 0.5 10*3/uL — AB (ref 0.7–4.0)
Lymphocytes Relative: 5 % — ABNORMAL LOW (ref 12–46)
MCH: 33.1 pg (ref 26.0–34.0)
MCHC: 33.1 g/dL (ref 30.0–36.0)
MCV: 100.3 fL — ABNORMAL HIGH (ref 78.0–100.0)
MONO ABS: 0.7 10*3/uL (ref 0.1–1.0)
MONOS PCT: 7 % (ref 3–12)
NEUTROS ABS: 9.1 10*3/uL — AB (ref 1.7–7.7)
NEUTROS PCT: 88 % — AB (ref 43–77)
Platelets: 247 10*3/uL (ref 150–400)
RBC: 3.59 MIL/uL — AB (ref 3.87–5.11)
RDW: 14 % (ref 11.5–15.5)
WBC: 10.4 10*3/uL (ref 4.0–10.5)

## 2014-06-20 LAB — COMPREHENSIVE METABOLIC PANEL
ALT: 17 U/L (ref 0–35)
AST: 22 U/L (ref 0–37)
Albumin: 3.7 g/dL (ref 3.5–5.2)
Alkaline Phosphatase: 46 U/L (ref 39–117)
Anion gap: 10 (ref 5–15)
BILIRUBIN TOTAL: 0.6 mg/dL (ref 0.3–1.2)
BUN: 16 mg/dL (ref 6–23)
CALCIUM: 8.3 mg/dL — AB (ref 8.4–10.5)
CHLORIDE: 101 mmol/L (ref 96–112)
CO2: 24 mmol/L (ref 19–32)
Creatinine, Ser: 0.65 mg/dL (ref 0.50–1.10)
Glucose, Bld: 99 mg/dL (ref 70–99)
Potassium: 3.8 mmol/L (ref 3.5–5.1)
SODIUM: 135 mmol/L (ref 135–145)
Total Protein: 6.2 g/dL (ref 6.0–8.3)

## 2014-06-20 LAB — RAPID URINE DRUG SCREEN, HOSP PERFORMED
Amphetamines: NOT DETECTED
BARBITURATES: NOT DETECTED
BENZODIAZEPINES: NOT DETECTED
COCAINE: NOT DETECTED
OPIATES: NOT DETECTED
Tetrahydrocannabinol: NOT DETECTED

## 2014-06-20 LAB — CARBAMAZEPINE LEVEL, TOTAL
CARBAMAZEPINE LVL: 26.9 ug/mL — AB (ref 4.0–12.0)
CARBAMAZEPINE LVL: 22.2 ug/mL — AB (ref 4.0–12.0)
Carbamazepine Lvl: 18.4 ug/mL (ref 4.0–12.0)

## 2014-06-20 LAB — I-STAT BETA HCG BLOOD, ED (MC, WL, AP ONLY): I-stat hCG, quantitative: <5 m[IU]/mL (ref <5)

## 2014-06-20 LAB — SALICYLATE LEVEL

## 2014-06-20 LAB — MAGNESIUM: Magnesium: 1.7 mg/dL (ref 1.5–2.5)

## 2014-06-20 LAB — ACETAMINOPHEN LEVEL: Acetaminophen (Tylenol), Serum: <10 ug/mL — ABNORMAL LOW (ref 10–30)

## 2014-06-20 LAB — ETHANOL

## 2014-06-20 MED ORDER — ACETAMINOPHEN 650 MG RE SUPP: 650.0000 mg | Freq: Four times a day (QID) | RECTAL | Status: DC | PRN

## 2014-06-20 MED ORDER — SODIUM CHLORIDE 0.9 % IV BOLUS (SEPSIS)
1000.0000 mL | Freq: Once | INTRAVENOUS | Status: AC
  Administered 2014-06-20: 1000 mL via INTRAVENOUS

## 2014-06-20 MED ORDER — SODIUM CHLORIDE 0.9 % IV SOLN
INTRAVENOUS | Status: AC
  Administered 2014-06-20: 100 mL/h via INTRAVENOUS

## 2014-06-20 MED ORDER — ONDANSETRON HCL 4 MG PO TABS: 4.0000 mg | ORAL_TABLET | Freq: Four times a day (QID) | ORAL | Status: DC | PRN

## 2014-06-20 MED ORDER — SODIUM CHLORIDE 0.9 % IJ SOLN
3.0000 mL | Freq: Two times a day (BID) | INTRAMUSCULAR | Status: DC
  Administered 2014-06-20 – 2014-06-24 (×7): 3 mL via INTRAVENOUS

## 2014-06-20 MED ORDER — PANTOPRAZOLE SODIUM 40 MG PO TBEC
40.0000 mg | DELAYED_RELEASE_TABLET | Freq: Every day | ORAL | Status: DC
  Administered 2014-06-20 – 2014-06-24 (×5): 40 mg via ORAL
  Filled 2014-06-20 (×2): qty 1

## 2014-06-20 MED ORDER — THIAMINE HCL 100 MG/ML IJ SOLN
100.0000 mg | Freq: Every day | INTRAMUSCULAR | Status: DC
  Filled 2014-06-20: qty 1

## 2014-06-20 MED ORDER — ONDANSETRON HCL 4 MG/2ML IJ SOLN: 4.0000 mg | Freq: Four times a day (QID) | INTRAMUSCULAR | Status: DC | PRN

## 2014-06-20 MED ORDER — ADULT MULTIVITAMIN W/MINERALS CH
1.0000 | ORAL_TABLET | Freq: Every day | ORAL | Status: DC
  Administered 2014-06-20 – 2014-06-24 (×5): 1 via ORAL
  Filled 2014-06-20 (×5): qty 1

## 2014-06-20 MED ORDER — LORAZEPAM 2 MG/ML IJ SOLN: 1.0000 mg | Freq: Four times a day (QID) | INTRAMUSCULAR | Status: AC | PRN

## 2014-06-20 MED ORDER — ACETAMINOPHEN 325 MG PO TABS: 650.0000 mg | ORAL_TABLET | Freq: Four times a day (QID) | ORAL | Status: DC | PRN

## 2014-06-20 MED ORDER — CLONAZEPAM 0.5 MG PO TABS
0.5000 mg | ORAL_TABLET | Freq: Two times a day (BID) | ORAL | Status: DC | PRN
  Administered 2014-06-20 – 2014-06-24 (×3): 0.5 mg via ORAL
  Filled 2014-06-20 (×3): qty 1

## 2014-06-20 MED ORDER — DIPHENHYDRAMINE HCL 50 MG/ML IJ SOLN
12.5000 mg | Freq: Three times a day (TID) | INTRAMUSCULAR | Status: DC | PRN
  Administered 2014-06-20 – 2014-06-21 (×2): 12.5 mg via INTRAVENOUS
  Filled 2014-06-20 (×3): qty 1

## 2014-06-20 MED ORDER — ENOXAPARIN SODIUM 40 MG/0.4ML ~~LOC~~ SOLN
40.0000 mg | SUBCUTANEOUS | Status: DC
  Administered 2014-06-20 – 2014-06-23 (×4): 40 mg via SUBCUTANEOUS
  Filled 2014-06-20 (×5): qty 0.4

## 2014-06-20 MED ORDER — ENOXAPARIN SODIUM 40 MG/0.4ML ~~LOC~~ SOLN
40.0000 mg | SUBCUTANEOUS | Status: DC
  Filled 2014-06-20: qty 0.4

## 2014-06-20 MED ORDER — SODIUM CHLORIDE 0.9 % IV SOLN
INTRAVENOUS | Status: DC
  Administered 2014-06-20: 22:00:00 via INTRAVENOUS
  Administered 2014-06-20: 75 mL/h via INTRAVENOUS
  Administered 2014-06-21: 09:00:00 via INTRAVENOUS

## 2014-06-20 MED ORDER — LORAZEPAM 1 MG PO TABS
1.0000 mg | ORAL_TABLET | Freq: Four times a day (QID) | ORAL | Status: AC | PRN
  Administered 2014-06-21 – 2014-06-23 (×3): 1 mg via ORAL
  Filled 2014-06-20 (×3): qty 1

## 2014-06-20 MED ORDER — VITAMIN B-1 100 MG PO TABS
100.0000 mg | ORAL_TABLET | Freq: Every day | ORAL | Status: DC
  Administered 2014-06-20 – 2014-06-24 (×5): 100 mg via ORAL
  Filled 2014-06-20 (×5): qty 1

## 2014-06-20 MED ORDER — FOLIC ACID 1 MG PO TABS
1.0000 mg | ORAL_TABLET | Freq: Every day | ORAL | Status: DC
  Administered 2014-06-20 – 2014-06-24 (×5): 1 mg via ORAL
  Filled 2014-06-20 (×5): qty 1

## 2014-06-20 MED ORDER — SODIUM CHLORIDE 0.9 % IV BOLUS (SEPSIS): 1000.0000 mL | Freq: Once | INTRAVENOUS | Status: DC

## 2014-06-20 NOTE — ED Provider Notes (Signed)
CSN: [id number]     Arrival date & time 06/20/14  6433 History   First MD Initiated Contact with Patient 06/20/14 [phone]     Chief Complaint  Patient presents with  . Fall  . Alcohol Problem  . Manic Behavior     (Consider location/radiation/quality/duration/timing/severity/associated sxs/prior Treatment) The history is provided by the patient.  Janet Lewis is a 44 y.o. female hx of bipolar, alcohol abuse, seizure, here with fall, alcohol abuse, overdose. She was in alcohol rehab a month ago. Relapsed almost immediately and drinks 1 pink of vodka at least once a week. Yesterday she drank some alcohol. Her husband discovered that she was drinking alcohol so she.upset and took a handful of her trazodone, Tegretol. She states that she filled her Tegretol and trazodone yesterday. She doesn't remember how many she took. She denies trying to kill herself but was just very upset. Denies hallucination. This morning she became weak and had trouble walking so she fell forward and hit her head.    Past Medical History  Diagnosis Date  . Bipolar disorder    Past Surgical History  Procedure Laterality Date  . Breast surgery  2008    implants   No family history on file. History  Substance Use Topics  . Smoking status: Never Smoker   . Smokeless tobacco: Never Used  . Alcohol Use: 4.8 oz/week    8 Glasses of wine per week   OB History    No data available     Review of Systems  Neurological: Positive for headaches.  Psychiatric/Behavioral: Positive for dysphoric mood.  All other systems reviewed and are negative.     Allergies  Codeine; Sulfa antibiotics; and Neosporin  Home Medications   Prior to Admission medications   Medication Sig Start Date End Date Taking? Authorizing Provider  carbamazepine (TEGRETOL XR) 400 MG 12 hr tablet Take 400 mg by mouth 2 (two) times daily.   Yes Historical Provider, MD  diphenhydrAMINE (BENADRYL) 25 mg capsule Take 1 capsule (25 mg total) by  mouth every 6 (six) hours as needed for itching. 04/07/14  Yes Kennedy Bucker, NP  Pseudoeph-Doxylamine-DM-APAP (NYQUIL PO) Take 1 Dose by mouth as needed (insomnia).   Yes Historical Provider, MD  traZODone (DESYREL) 100 MG tablet Take 1 tablet (100 mg total) by mouth at bedtime. 04/07/14  Yes Kennedy Bucker, NP  buPROPion (WELLBUTRIN XL) 300 MG 24 hr tablet Take 1 tablet (300 mg total) by mouth every morning. Patient not taking: Reported on 06/20/2014 04/07/14   Kennedy Bucker, NP  cloNIDine (CATAPRES) 0.1 MG tablet Take 1 tablet (0.1 mg total) by mouth at bedtime. Patient not taking: Reported on 06/20/2014 04/07/14   Kennedy Bucker, NP  lurasidone (LATUDA) 80 MG TABS tablet Take 1 tablet (80 mg total) by mouth daily. Patient not taking: Reported on 06/20/2014 04/07/14   Kennedy Bucker, NP  naltrexone (DEPADE) 50 MG tablet Take 0.5 tablets (25 mg total) by mouth daily. For treatment of alcoholism Patient not taking: Reported on 06/20/2014 06/17/12   Encarnacion Slates, NP  Oxcarbazepine (TRILEPTAL) 600 MG tablet Take 1 tablet (600 mg total) by mouth 2 (two) times daily. Patient not taking: Reported on 06/20/2014 04/07/14   Kennedy Bucker, NP  pantoprazole (PROTONIX) 20 MG tablet Take 1 tablet (20 mg total) by mouth daily. Patient not taking: Reported on 06/20/2014 04/07/14   Kennedy Bucker, NP  sucralfate (CARAFATE) 1 G tablet Take 1 tablet (1 g total) by mouth 2 (two) times daily. Patient  not taking: Reported on 06/20/2014 04/07/14   Kennedy Bucker, NP   BP 102/50 mmHg  Pulse 92  Temp(Src) 98.2 F (36.8 C) (Oral)  Resp 12  SpO2 97% Physical Exam  Constitutional:  Intoxicated, uncomfortable. Tearful   HENT:  Head: Normocephalic.  Mild forehead hematoma   Eyes: Conjunctivae are normal. Pupils are equal, round, and reactive to light.  Neck:  C collar in place   Cardiovascular: Normal rate, regular rhythm and normal heart sounds.   Pulmonary/Chest: Effort normal and breath sounds normal. No  respiratory distress. She has no wheezes. She has no rales.  Abdominal: Soft. Bowel sounds are normal. She exhibits no distension. There is no tenderness. There is no rebound and no guarding.  Musculoskeletal: Normal range of motion.  Neurological:  Occasional twitches. Nl strength throughout. Alert and oriented   Skin: Skin is warm and dry.  Psychiatric:  Depressed   Nursing note and vitals reviewed.   ED Course  Procedures (including critical care time)  CRITICAL CARE- drug overdose Performed by: Darl Householder, Daley Gosse   Total critical care time: 30 min  Critical care time was exclusive of separately billable procedures and treating other patients.  Critical care was necessary to treat or prevent imminent or life-threatening deterioration.  Critical care was time spent personally by me on the following activities: development of treatment plan with patient and/or surrogate as well as nursing, discussions with consultants, evaluation of patient's response to treatment, examination of patient, obtaining history from patient or surrogate, ordering and performing treatments and interventions, ordering and review of laboratory studies, ordering and review of radiographic studies, pulse oximetry and re-evaluation of patient's condition.   Labs Review Labs Reviewed  CBC WITH DIFFERENTIAL/PLATELET - Abnormal; Notable for the following:    RBC 3.59 (*)    Hemoglobin 11.9 (*)    MCV 100.3 (*)    Neutrophils Relative % 88 (*)    Neutro Abs 9.1 (*)    Lymphocytes Relative 5 (*)    Lymphs Abs 0.5 (*)    All other components within normal limits  COMPREHENSIVE METABOLIC PANEL - Abnormal; Notable for the following:    Calcium 8.3 (*)    All other components within normal limits  ACETAMINOPHEN LEVEL - Abnormal; Notable for the following:    Acetaminophen (Tylenol), Serum <10.0 (*)    All other components within normal limits  CARBAMAZEPINE LEVEL, TOTAL - Abnormal; Notable for the following:     Carbamazepine Lvl 26.9 (*)    All other components within normal limits  ETHANOL  SALICYLATE LEVEL  URINE RAPID DRUG SCREEN (HOSP PERFORMED)  I-STAT BETA HCG BLOOD, ED ([facility], [facility], [facility] ONLY)    Imaging Review Ct Head Wo Contrast  06/20/2014   CLINICAL DATA:  Fall with tremors and jerking. History of alcoholism.  EXAM: CT HEAD WITHOUT CONTRAST  CT CERVICAL SPINE WITHOUT CONTRAST  TECHNIQUE: Multidetector CT imaging of the head and cervical spine was performed following the standard protocol without intravenous contrast. Multiplanar CT image reconstructions of the cervical spine were also generated.  COMPARISON:  None.  FINDINGS: CT HEAD FINDINGS  The brain demonstrates no evidence of hemorrhage, infarction, edema, mass effect, extra-axial fluid collection, hydrocephalus or mass lesion. The skull is unremarkable.  CT CERVICAL SPINE FINDINGS  The cervical spine shows normal alignment. There is no evidence of acute fracture or subluxation. No soft tissue swelling or hematoma is identified. Mild cervical spondylosis at C6-7. No bony or soft tissue lesions are seen. The visualized airway is  normally patent.  IMPRESSION: No acute findings by head CT or cervical CT. Mild cervical spondylosis at C6-7.   Electronically Signed   By: Aletta Edouard M.D.   On: 06/20/2014 09:45   Ct Cervical Spine Wo Contrast  06/20/2014   CLINICAL DATA:  Fall with tremors and jerking. History of alcoholism.  EXAM: CT HEAD WITHOUT CONTRAST  CT CERVICAL SPINE WITHOUT CONTRAST  TECHNIQUE: Multidetector CT imaging of the head and cervical spine was performed following the standard protocol without intravenous contrast. Multiplanar CT image reconstructions of the cervical spine were also generated.  COMPARISON:  None.  FINDINGS: CT HEAD FINDINGS  The brain demonstrates no evidence of hemorrhage, infarction, edema, mass effect, extra-axial fluid collection, hydrocephalus or mass lesion. The skull is unremarkable.  CT CERVICAL SPINE FINDINGS   The cervical spine shows normal alignment. There is no evidence of acute fracture or subluxation. No soft tissue swelling or hematoma is identified. Mild cervical spondylosis at C6-7. No bony or soft tissue lesions are seen. The visualized airway is normally patent.  IMPRESSION: No acute findings by head CT or cervical CT. Mild cervical spondylosis at C6-7.   Electronically Signed   By: Aletta Edouard M.D.   On: 06/20/2014 09:45     EKG Interpretation   Date/Time:  Wednesday June 20 2014 08:25:35 EST Ventricular Rate:  88 PR Interval:  174 QRS Duration: 90 QT Interval:  368 QTC Calculation: 445 R Axis:   19 Text Interpretation:  sinus rhythm Ventricular premature complex Probable  left atrial enlargement No previous ECGs available Confirmed by Nhat Hearne  MD,  Shooter Tangen ([id number]) on 06/20/2014 8:33:14 AM      MDM   Final diagnoses:  None   Janet Lewis is a 44 y.o. female here with overdose. She filled trileptal yesterday, there were 60 pills in the bottle. She also filled trazodone 30 pills. She doesn't remember how many she took. Will call poison control. Will check carbamazepine level. Will get CT head/neck due to fall. Will reassess.   10:57 AM Poison control recommend observation, repeat tegretol levels. Initial level was elevated. Called Dr. Doy Mince, who recommend supportive treatment and holding trazodone, tegretol. Neuro to see patient. Will admit for monitoring.    Wandra Arthurs, MD 06/20/14 [phone]

## 2014-06-20 NOTE — Progress Notes (Signed)
CRITICAL VALUE ALERT  Critical value received:  Carbamazepine level 22.2  Date of notification:  06/20/2014  Time of notification:  1948  Critical value read back:Yes.    Nurse who received alert: Job Founds, RN  MD notified (1st page):  Dr. Hilbert Bible  Time of first page:  1953  Responding MD: Dr. Hilbert Bible  Time MD responded:  2016

## 2014-06-20 NOTE — H&P (Signed)
[facility] History and Physical  Janet Lewis ZOX:[id number] DOB: October 02, 1970 DOA: 06/20/2014  Referring physician: Dr. Darl Householder PCP: Myrlene Broker, MD   Chief Complaint:  Drug overdose   HPI:  44 year old alcoholic female with history of bipolar disorder, with inpatient alcohol detox in December was brought to the ED for overdose of carbamazepine and trazodone . Patient drinks 1 pint of 4 times daily and reports trying to quit for past 1 week until yesterday when she again started drinking since the morning. She had confrontation with her husband late in the day and took a handful of trazodone and Tegretol. She reports taking the medications in anger and did not intend to kill herself. Denies suicidal ideation at this time. She had an episode of vomiting in the evening but went off to sleep. This morning when she got up to go to work she was very unsteady and fell 3 times at home. She reports having some headache and some dizziness. Denies  headache, dizziness, fever, chills,  chest pain, palpitations, SOB, abdominal pain, bowel or urinary symptoms. Denies change in weight or appetite. Denies history of alcohol withdrawal symptoms. Denies any other drug use.  Course in the ED   patient's vitals were stable. Blood will done showed the Ceftin 0.4, hemoglobin of 11.9, hematocrit of 36, platelets of 247, sodium of 135, potassium 3.8, chloride 11, CO2 24, normal renal function and glucose. Head CT and CT of the cervical spine were unremarkable. Carbamazepine level was elevated to 26. Neuro hospitalist was consulted who recommended admission under observation with supportive care. Poison control was consulted by ED physician who recommended monitoring her carbamazepine level every 6 hours until normal. Hospitalist admission requested to telemetry.  Review of Systems:  Constitutional: Denies fever, chills, diaphoresis, appetite change and fatigue.  HEENT: Denies photophobia, eye pain,  hearing  loss, ear pain, congestion, , trouble swallowing, neck pain, neck stiffness and tinnitus.   Respiratory: Denies SOB, DOE, cough, chest tightness,  and wheezing.   Cardiovascular: Denies chest pain, palpitations and leg swelling.  Gastrointestinal: nausea, vomiting, denies abdominal pain, diarrhea, constipation, blood in stool and abdominal distention.  Genitourinary: Denies dysuria, urgency, frequency, hematuria, flank pain and difficulty urinating.  Endocrine: Denies: hot or cold intolerance, sweats, , polyuria, polydipsia. Musculoskeletal: Denies myalgias, back pain, joint swelling, arthralgias , unsteady gait Skin: Denies pallor, rash and wound.  Neurological: Headache, dizziness, unsteady gait Denies dizziness, seizures, syncope, weakness, light-headedness, numbness and headaches.  Hematological: Denies adenopathy.  Psychiatric/Behavioral: Mood changes++, Denies suicidal ideation,  confusion, nervousness, sleep disturbance and agitation   Past Medical History  Diagnosis Date  . Bipolar disorder    Past Surgical History  Procedure Laterality Date  . Breast surgery  2008    implants   Social History:  reports that she has never smoked. She has never used smokeless tobacco. She reports that she drinks about 4.8 oz of alcohol per week. She reports that she does not use illicit drugs.  Allergies  Allergen Reactions  . Codeine Itching    Pt reports allergic reaction of itching to "all pain medications"  . Sulfa Antibiotics Hives  . Neosporin [Neomycin-Bacitracin Zn-Polymyx] Rash    No family history on file.  Prior to Admission medications   Medication Sig Start Date End Date Taking? Authorizing Provider  carbamazepine (TEGRETOL XR) 400 MG 12 hr tablet Take 400 mg by mouth 2 (two) times daily.   Yes Historical Provider, MD  diphenhydrAMINE (BENADRYL) 25 mg capsule Take 1 capsule (25 mg  total) by mouth every 6 (six) hours as needed for itching. 04/07/14  Yes Kennedy Bucker, NP   Pseudoeph-Doxylamine-DM-APAP (NYQUIL PO) Take 1 Dose by mouth as needed (insomnia).   Yes Historical Provider, MD  traZODone (DESYREL) 100 MG tablet Take 1 tablet (100 mg total) by mouth at bedtime. 04/07/14  Yes Kennedy Bucker, NP  buPROPion (WELLBUTRIN XL) 300 MG 24 hr tablet Take 1 tablet (300 mg total) by mouth every morning. Patient not taking: Reported on 06/20/2014 04/07/14   Kennedy Bucker, NP  cloNIDine (CATAPRES) 0.1 MG tablet Take 1 tablet (0.1 mg total) by mouth at bedtime. Patient not taking: Reported on 06/20/2014 04/07/14   Kennedy Bucker, NP  lurasidone (LATUDA) 80 MG TABS tablet Take 1 tablet (80 mg total) by mouth daily. Patient not taking: Reported on 06/20/2014 04/07/14   Kennedy Bucker, NP  naltrexone (DEPADE) 50 MG tablet Take 0.5 tablets (25 mg total) by mouth daily. For treatment of alcoholism Patient not taking: Reported on 06/20/2014 06/17/12   Encarnacion Slates, NP  Oxcarbazepine (TRILEPTAL) 600 MG tablet Take 1 tablet (600 mg total) by mouth 2 (two) times daily. Patient not taking: Reported on 06/20/2014 04/07/14   Kennedy Bucker, NP  pantoprazole (PROTONIX) 20 MG tablet Take 1 tablet (20 mg total) by mouth daily. Patient not taking: Reported on 06/20/2014 04/07/14   Kennedy Bucker, NP  sucralfate (CARAFATE) 1 G tablet Take 1 tablet (1 g total) by mouth 2 (two) times daily. Patient not taking: Reported on 06/20/2014 04/07/14   Kennedy Bucker, NP     Physical Exam:  Filed Vitals:   06/20/14 0915 06/20/14 1015 06/20/14 1030 06/20/14 1100  BP: 105/60 105/50 102/50 103/55  Pulse: 99 92 92 99  Temp:      TempSrc:      Resp:  11 12 21   SpO2: 98% 97% 97% 97%    Constitutional: Vital signs reviewed.  Middle aged female lying in bed appears flushed  HEENT: no pallor, no icterus, moist oral mucosa, no cervical lymphadenopathy Cardiovascular: RRR, S1 normal, S2 normal, no MRG Chest: CTAB, no wheezes, rales, or rhonchi Abdominal: Soft. Non-tender, non-distended, bowel sounds  are normal,  Ext: warm, no edema Neurological: A&O x3, cranial nerves 2-12 intact, normal motor strength, tone and reflexes, abnormal twitching of arms and legs, unsteady gait. Normal coordination.,  Labs on Admission:  Basic Metabolic Panel:  Recent Labs Lab 06/20/14 0855  NA 135  K 3.8  CL 101  CO2 24  GLUCOSE 99  BUN 16  CREATININE 0.65  CALCIUM 8.3*   Liver Function Tests:  Recent Labs Lab 06/20/14 0855  AST 22  ALT 17  ALKPHOS 46  BILITOT 0.6  PROT 6.2  ALBUMIN 3.7   No results for input(s): LIPASE, AMYLASE in the last 168 hours. No results for input(s): AMMONIA in the last 168 hours. CBC:  Recent Labs Lab 06/20/14 0855  WBC 10.4  NEUTROABS 9.1*  HGB 11.9*  HCT 36.0  MCV 100.3*  PLT 247   Cardiac Enzymes: No results for input(s): CKTOTAL, CKMB, CKMBINDEX, TROPONINI in the last 168 hours. BNP: Invalid input(s): POCBNP CBG: No results for input(s): GLUCAP in the last 168 hours.  Radiological Exams on Admission: Ct Head Wo Contrast  06/20/2014   CLINICAL DATA:  Fall with tremors and jerking. History of alcoholism.  EXAM: CT HEAD WITHOUT CONTRAST  CT CERVICAL SPINE WITHOUT CONTRAST  TECHNIQUE: Multidetector CT imaging of the head and cervical spine was performed following the standard protocol without  intravenous contrast. Multiplanar CT image reconstructions of the cervical spine were also generated.  COMPARISON:  None.  FINDINGS: CT HEAD FINDINGS  The brain demonstrates no evidence of hemorrhage, infarction, edema, mass effect, extra-axial fluid collection, hydrocephalus or mass lesion. The skull is unremarkable.  CT CERVICAL SPINE FINDINGS  The cervical spine shows normal alignment. There is no evidence of acute fracture or subluxation. No soft tissue swelling or hematoma is identified. Mild cervical spondylosis at C6-7. No bony or soft tissue lesions are seen. The visualized airway is normally patent.  IMPRESSION: No acute findings by head CT or cervical CT.  Mild cervical spondylosis at C6-7.   Electronically Signed   By: Aletta Edouard M.D.   On: 06/20/2014 09:45   Ct Cervical Spine Wo Contrast  06/20/2014   CLINICAL DATA:  Fall with tremors and jerking. History of alcoholism.  EXAM: CT HEAD WITHOUT CONTRAST  CT CERVICAL SPINE WITHOUT CONTRAST  TECHNIQUE: Multidetector CT imaging of the head and cervical spine was performed following the standard protocol without intravenous contrast. Multiplanar CT image reconstructions of the cervical spine were also generated.  COMPARISON:  None.  FINDINGS: CT HEAD FINDINGS  The brain demonstrates no evidence of hemorrhage, infarction, edema, mass effect, extra-axial fluid collection, hydrocephalus or mass lesion. The skull is unremarkable.  CT CERVICAL SPINE FINDINGS  The cervical spine shows normal alignment. There is no evidence of acute fracture or subluxation. No soft tissue swelling or hematoma is identified. Mild cervical spondylosis at C6-7. No bony or soft tissue lesions are seen. The visualized airway is normally patent.  IMPRESSION: No acute findings by head CT or cervical CT. Mild cervical spondylosis at C6-7.   Electronically Signed   By: Aletta Edouard M.D.   On: 06/20/2014 09:45    EKG: Normal sinus rhythm with a few PVCs, no ST-T changes  Assessment/Plan Principal Problem:   Drug overdose with Tegretol toxicity Admit to telemetry for close monitoring. Neurochecks every 2 hours for next 24 hours. -Check urine drug screen. -Monitor Tegretol level every 6 hours as per recommendations from poison control. Supportive care with IV fluids, antiemetics. -Monitor for neurological and cardiac side effects. -Monitor electrolytes and replenish. Monitor LFTs. -Patient has unsteady gait and some twitching on exam. PT eval. -Psych consult. -Discontinue Tegretol and trazodone for now. -Add PPI  Active Problems:   Alcohol dependence Patient has ongoing alcohol use and was in alcohol rehabilitation 2 months  ago. -Last drink was yesterday after attempting to quit for almost a week. Monitor on CIWA. Add thiamine, folic and multivitamin. IV fluids.    Episodic mood disorder Hold Tegretol and trazodone        Diet: Regular  DVT prophylaxis: sq lovenox   Code Status: full code Family Communication: None at bedside Disposition Plan: Admit to telemetry  [place], [facility] Pager [phone]  Total time spent on admission :60 minutes  If 7PM-7AM, please contact night-coverage [email or web address] Password [facility] 06/20/2014, 11:46 AM

## 2014-06-20 NOTE — ED Notes (Signed)
Pt belongings given to mother, Izora Gala, per pt request.

## 2014-06-20 NOTE — ED Notes (Signed)
Pt reports she is bipolar, drank a 5th of vodka. Was stressed out at home because he husband caught her relapsed on drinking. Took a handful of trazodone and tegretol to cope with feelings. Since then has fallen x3 since 6am and stating she cant stop with tremors/jerking. Pt currently awake, alert, oriented x4. CBG 86. C-collar in place. Abrasion noted to right ankle. Denies boney pain.

## 2014-06-20 NOTE — Progress Notes (Addendum)
Janet Lewis [id number] Admission Data: 06/20/2014 3:13 PM Attending Provider: Louellen Molder, MD  TKZ:[id number],[id number] A, MD Consults/ Treatment Team: Treatment Team:  Catarina Hartshorn, MD  Janet Lewis is a 44 y.o. female patient admitted from ED awake, alert  & orientated  X 3,  Full Code, VSS - Blood pressure 101/70, pulse 94, temperature 98.5 F (36.9 C), temperature source Oral, resp. rate 12, height 5' 6"  (1.676 m), weight 70.534 kg (155 lb 8 oz), SpO2 97 %., no c/o shortness of breath, no c/o chest pain, no distress noted. Tele # 22 placed.  IV site WDL: Right A/C running NS.   Allergies:   Allergies  Allergen Reactions  . Codeine Itching    Pt reports allergic reaction of itching to "all pain medications"  . Sulfa Antibiotics Hives  . Neosporin [Neomycin-Bacitracin Zn-Polymyx] Rash     Past Medical History  Diagnosis Date  . Bipolar disorder    Last alcoholic drink- A pint of Vodka.  Pt orientation to unit, room and routine. Information packet given to patient/family and safety video watched.  Admission INP armband ID verified with patient/family, and in place. SR up x 2, fall risk assessment complete with Patient and family verbalizing understanding of risks associated with falls. Pt verbalizes an understanding of how to use the call bell and to call for help before getting out of bed.  Skin, clean-dry-generalized bruising noted,  scratch noted to Right ankle area, scratches noted to elbows too. Pt. States "I'm itchy".   Will cont to monitor and assist as needed.  Dayle Points, RN 06/20/2014 3:13 PM

## 2014-06-20 NOTE — Progress Notes (Signed)
CRITICAL VALUE ALERT  Critical value received: Carbamazepine level 18.4  Date of notification:  06/20/2014  Time of notification:  2242  Critical value read back:Yes.    Nurse who received alert:  Job Founds, RN  MD notified (1st page):  Dr. Hilbert Bible  Time of first page:  2246  Responding MD:  Dr. Hilbert Bible

## 2014-06-20 NOTE — ED Notes (Signed)
PT HAS ARRIVED ON POD C TO AWAIT BED ASSIGNMENT

## 2014-06-20 NOTE — Progress Notes (Signed)
Spoke with Langley Gauss from poison control, informed her about pt vital sign, ekg and pt latest tegertol level of 18.4. Requested a repeat EKG, with electolyte labs in the AM. Will paged the MD and make aware. Will continue to monitor pt.

## 2014-06-20 NOTE — Consult Note (Signed)
NEURO HOSPITALIST CONSULT NOTE    Reason for Consult: Tegretol and Trazodone overdose  HPI:                                                                                                                                          Janet Lewis is an 44 y.o. female who states she is a heavy drinker but trying to quit.  She states her last drink prior to yesterday was one week ago.  Yesterday she drank a pint of Vodka and then had a argument with her husband about her drinking.  She took her Tegretol bottle and her Trazodone bottle and poured the pills in her hand and took them.  She is unsure how many she actually took. She went to sleep and awoke this AM to go to work.  She was unable to keep her balance and could not stay upright. She was brought to hospital due to her OD.  Currently she continues to feel very off balance when standing.  When awake she feels she is unable to move her limbs smoothly and "keeps jerking".  She also states she is very "itchy".  She denies dizziness while in bed, denies nausea or vomiting.    Past Medical History  Diagnosis Date  . Bipolar disorder     Past Surgical History  Procedure Laterality Date  . Breast surgery  2008    implants    Family History  Problem Relation Age of Onset  . Heart failure Mother   . Hypertension Mother   . Hypertension Father      Social History:  reports that she has never smoked. She has never used smokeless tobacco. She reports that she drinks about 4.8 oz of alcohol per week. She reports that she does not use illicit drugs.  Allergies  Allergen Reactions  . Codeine Itching    Pt reports allergic reaction of itching to "all pain medications"  . Sulfa Antibiotics Hives  . Neosporin [Neomycin-Bacitracin Zn-Polymyx] Rash    MEDICATIONS:                                                                                                                     Current Facility-Administered Medications   Medication Dose Route Frequency Provider Last Rate Last Dose  .  0.9 %  sodium chloride infusion   Intravenous STAT Wandra Arthurs, MD      . 0.9 %  sodium chloride infusion   Intravenous Continuous Nishant Dhungel, MD      . acetaminophen (TYLENOL) tablet 650 mg  650 mg Oral Q6H PRN Nishant Dhungel, MD       Or  . acetaminophen (TYLENOL) suppository 650 mg  650 mg Rectal Q6H PRN Nishant Dhungel, MD      . enoxaparin (LOVENOX) injection 40 mg  40 mg Subcutaneous Q24H Nishant Dhungel, MD      . folic acid (FOLVITE) tablet 1 mg  1 mg Oral Daily Nishant Dhungel, MD      . LORazepam (ATIVAN) tablet 1 mg  1 mg Oral Q6H PRN Nishant Dhungel, MD       Or  . LORazepam (ATIVAN) injection 1 mg  1 mg Intravenous Q6H PRN Nishant Dhungel, MD      . multivitamin with minerals tablet 1 tablet  1 tablet Oral Daily Nishant Dhungel, MD      . ondansetron (ZOFRAN) tablet 4 mg  4 mg Oral Q6H PRN Nishant Dhungel, MD       Or  . ondansetron (ZOFRAN) injection 4 mg  4 mg Intravenous Q6H PRN Nishant Dhungel, MD      . pantoprazole (PROTONIX) EC tablet 40 mg  40 mg Oral Daily Nishant Dhungel, MD      . sodium chloride 0.9 % injection 3 mL  3 mL Intravenous Q12H Nishant Dhungel, MD      . thiamine (VITAMIN B-1) tablet 100 mg  100 mg Oral Daily Nishant Dhungel, MD       Or  . thiamine (B-1) injection 100 mg  100 mg Intravenous Daily Nishant Dhungel, MD       Current Outpatient Prescriptions  Medication Sig Dispense Refill  . carbamazepine (TEGRETOL XR) 400 MG 12 hr tablet Take 400 mg by mouth 2 (two) times daily.    . diphenhydrAMINE (BENADRYL) 25 mg capsule Take 1 capsule (25 mg total) by mouth every 6 (six) hours as needed for itching. 30 capsule 0  . Pseudoeph-Doxylamine-DM-APAP (NYQUIL PO) Take 1 Dose by mouth as needed (insomnia).    . traZODone (DESYREL) 100 MG tablet Take 1 tablet (100 mg total) by mouth at bedtime.  1  . buPROPion (WELLBUTRIN XL) 300 MG 24 hr tablet Take 1 tablet (300 mg total) by mouth every  morning. (Patient not taking: Reported on 06/20/2014)  1  . cloNIDine (CATAPRES) 0.1 MG tablet Take 1 tablet (0.1 mg total) by mouth at bedtime. (Patient not taking: Reported on 06/20/2014) 60 tablet 1  . lurasidone (LATUDA) 80 MG TABS tablet Take 1 tablet (80 mg total) by mouth daily. (Patient not taking: Reported on 06/20/2014) 30 tablet 1  . naltrexone (DEPADE) 50 MG tablet Take 0.5 tablets (25 mg total) by mouth daily. For treatment of alcoholism (Patient not taking: Reported on 06/20/2014) 30 tablet 0  . Oxcarbazepine (TRILEPTAL) 600 MG tablet Take 1 tablet (600 mg total) by mouth 2 (two) times daily. (Patient not taking: Reported on 06/20/2014)    . pantoprazole (PROTONIX) 20 MG tablet Take 1 tablet (20 mg total) by mouth daily. (Patient not taking: Reported on 06/20/2014) 30 tablet 1  . sucralfate (CARAFATE) 1 G tablet Take 1 tablet (1 g total) by mouth 2 (two) times daily. (Patient not taking: Reported on 06/20/2014) 60 tablet 0      ROS:  History obtained from the patient  General ROS: negative for - chills, fatigue, fever, night sweats, weight gain or weight loss Psychological ROS: negative for - behavioral disorder, hallucinations, memory difficulties, mood swings or suicidal ideation Ophthalmic ROS: negative for - blurry vision, double vision, eye pain or loss of vision ENT ROS: negative for - epistaxis, nasal discharge, oral lesions, sore throat, tinnitus or vertigo Allergy and Immunology ROS: negative for - hives or itchy/watery eyes Hematological and Lymphatic ROS: negative for - bleeding problems, bruising or swollen lymph nodes Endocrine ROS: negative for - galactorrhea, hair pattern changes, polydipsia/polyuria or temperature intolerance Respiratory ROS: negative for - cough, hemoptysis, shortness of breath or wheezing Cardiovascular ROS: negative for -  chest pain, dyspnea on exertion, edema or irregular heartbeat Gastrointestinal ROS: negative for - abdominal pain, diarrhea, hematemesis, nausea/vomiting or stool incontinence Genito-Urinary ROS: negative for - dysuria, hematuria, incontinence or urinary frequency/urgency Musculoskeletal ROS: negative for - joint swelling or muscular weakness Neurological ROS: as noted in HPI Dermatological ROS: negative for rash and skin lesion changes   Blood pressure 101/57, pulse 94, temperature 98.2 F (36.8 C), temperature source Oral, resp. rate 14, SpO2 95 %.   Neurologic Examination:                                                                                                      HEENT-  Normocephalic, no lesions, without obvious abnormality.  Normal external eye and conjunctiva.  Normal TM's bilaterally.  Normal auditory canals and external ears. Normal external nose, mucus membranes and septum.  Normal pharynx. Cardiovascular- S1, S2 normal, pulses palpable throughout   Lungs- chest clear, no wheezing, rales, normal symmetric air entry Abdomen- normal findings: bowel sounds normal Extremities- no edema Lymph-no adenopathy palpable Musculoskeletal-no joint tenderness, deformity or swelling Skin-warm and dry, no hyperpigmentation, vitiligo, or suspicious lesions  Neurological Examination Mental Status: Alert, oriented, thought content appropriate.  Speech fluent without evidence of aphasia.  Able to follow 3 step commands without difficulty. Cranial Nerves: II: Discs flat bilaterally; Visual fields grossly normal, pupils equal, round, reactive to light and accommodation III,IV, VI: ptosis not present, extra-ocular motions intact bilaterally V,VII: smile symmetric, facial light touch sensation normal bilaterally VIII: hearing normal bilaterally IX,X: gag reflex present XI: bilateral shoulder shrug XII: midline tongue extension Motor: Right : Upper extremity   5/5    Left:     Upper  extremity   5/5  Lower extremity   5/5     Lower extremity   5/5 --noted intermittent polymyoclonus in both UE and LE including face and neck Tone and bulk:normal tone throughout; no atrophy noted Sensory: Pinprick and light touch intact throughout, bilaterally Deep Tendon Reflexes: 2+ and symmetric throughout bilateral UE and no KJ or AJ Plantars: Right: downgoing   Left: downgoing Cerebellar: normal finger-to-nose,  and normal heel-to-shin test Gait: unable to assess due feeling of not able to keep balance.     Lab Results: Basic Metabolic Panel:  Recent Labs Lab 06/20/14 0855  NA 135  K 3.8  CL 101  CO2 24  GLUCOSE  99  BUN 16  CREATININE 0.65  CALCIUM 8.3*  MG 1.7    Liver Function Tests:  Recent Labs Lab 06/20/14 0855  AST 22  ALT 17  ALKPHOS 46  BILITOT 0.6  PROT 6.2  ALBUMIN 3.7   No results for input(s): LIPASE, AMYLASE in the last 168 hours. No results for input(s): AMMONIA in the last 168 hours.  CBC:  Recent Labs Lab 06/20/14 0855  WBC 10.4  NEUTROABS 9.1*  HGB 11.9*  HCT 36.0  MCV 100.3*  PLT 247    Cardiac Enzymes: No results for input(s): CKTOTAL, CKMB, CKMBINDEX, TROPONINI in the last 168 hours.  Lipid Panel: No results for input(s): CHOL, TRIG, HDL, CHOLHDL, VLDL, LDLCALC in the last 168 hours.  CBG: No results for input(s): GLUCAP in the last 168 hours.  Microbiology: No results found for this or any previous visit.  Coagulation Studies: No results for input(s): LABPROT, INR in the last 72 hours.  Imaging: Ct Head Wo Contrast  06/20/2014   CLINICAL DATA:  Fall with tremors and jerking. History of alcoholism.  EXAM: CT HEAD WITHOUT CONTRAST  CT CERVICAL SPINE WITHOUT CONTRAST  TECHNIQUE: Multidetector CT imaging of the head and cervical spine was performed following the standard protocol without intravenous contrast. Multiplanar CT image reconstructions of the cervical spine were also generated.  COMPARISON:  None.  FINDINGS:  CT HEAD FINDINGS  The brain demonstrates no evidence of hemorrhage, infarction, edema, mass effect, extra-axial fluid collection, hydrocephalus or mass lesion. The skull is unremarkable.  CT CERVICAL SPINE FINDINGS  The cervical spine shows normal alignment. There is no evidence of acute fracture or subluxation. No soft tissue swelling or hematoma is identified. Mild cervical spondylosis at C6-7. No bony or soft tissue lesions are seen. The visualized airway is normally patent.  IMPRESSION: No acute findings by head CT or cervical CT. Mild cervical spondylosis at C6-7.   Electronically Signed   By: Aletta Edouard M.D.   On: 06/20/2014 09:45   Ct Cervical Spine Wo Contrast  06/20/2014   CLINICAL DATA:  Fall with tremors and jerking. History of alcoholism.  EXAM: CT HEAD WITHOUT CONTRAST  CT CERVICAL SPINE WITHOUT CONTRAST  TECHNIQUE: Multidetector CT imaging of the head and cervical spine was performed following the standard protocol without intravenous contrast. Multiplanar CT image reconstructions of the cervical spine were also generated.  COMPARISON:  None.  FINDINGS: CT HEAD FINDINGS  The brain demonstrates no evidence of hemorrhage, infarction, edema, mass effect, extra-axial fluid collection, hydrocephalus or mass lesion. The skull is unremarkable.  CT CERVICAL SPINE FINDINGS  The cervical spine shows normal alignment. There is no evidence of acute fracture or subluxation. No soft tissue swelling or hematoma is identified. Mild cervical spondylosis at C6-7. No bony or soft tissue lesions are seen. The visualized airway is normally patent.  IMPRESSION: No acute findings by head CT or cervical CT. Mild cervical spondylosis at C6-7.   Electronically Signed   By: Aletta Edouard M.D.   On: 06/20/2014 09:45   EKG shows NSR at 88 bpm without prolonged QRS   Etta Quill PA-C [facility] [phone]  06/20/2014, 1:54 PM  Patient seen and examined.  Clinical course and management discussed.   Necessary edits performed.  I agree with the above.  Assessment and plan of care developed and discussed below.    Assessment/Plan: 44 year old female s/p overdose with Tegretol and Trazodone.  Has had ETOH in excess as well.  Now showing signs  of toxicity with gait instability and myoclonus.  ETOH level <5.  Tegretol level 26.9.  With discontinuation of above agents, patient should improve.  Head CT personally reviewed and shows no acute changes.    Recommendations: 1.  Agree with discontinuation of Tegretol, Trileptal and Trazodone.   2.  Agree with monitoring of Tegretol levels 3.  EEG 4.  Clonazepam 0.43m BID prn myoclonus 5.  No further imaging indicated at this time.      LAlexis Goodell MD [facility] [phone] 06/20/2014  3:28 PM

## 2014-06-20 NOTE — ED Notes (Addendum)
Per poison control: Trazodone, QTC prolongation, HYpotension, bradycardia Tegretol: CNS depression, hypotension, tachycardia,  QRS widening, QTC prolongation, seizures. Watch 12-24 hours from time of ingestion. Recheck tegretol q6 hours until peaked and declining. Benzos as needed for seizures. REpeat EKG prior to discharge.

## 2014-06-20 NOTE — ED Notes (Signed)
Janet Lewis

## 2014-06-20 NOTE — ED Notes (Signed)
Contacted security to wand pt.

## 2014-06-20 NOTE — Procedures (Addendum)
ELECTROENCEPHALOGRAM REPORT   Patient: Janet Lewis       Room #: 9J-09 EEG No. ID:  Age: 44 y.o.        Sex: female Referring Physician: Dr Clementeen Graham Report Date:  06/20/2014        Interpreting Physician: Jim Like, Larkin Ina  History: Shalandria Elsbernd is an 44 y.o. female history of bipolar presenting with OD on tegretol and trazodone with excessive EtOH intake.   Medications:  Scheduled: . sodium chloride   Intravenous STAT  . enoxaparin (LOVENOX) injection  40 mg Subcutaneous Q24H  . folic acid  1 mg Oral Daily  . multivitamin with minerals  1 tablet Oral Daily  . pantoprazole  40 mg Oral Daily  . sodium chloride  3 mL Intravenous Q12H  . thiamine  100 mg Oral Daily   Or  . thiamine  100 mg Intravenous Daily    Conditions of Recording:  This is a 16 channel EEG carried out with the patient in the drowsy state.  Description:  The waking background activity consists predominantly of a low voltage, poorly organized theta activity with intermittent periods of alpha activity in the 9-11 Hz range. No focal slowing or epileptiform activity is noted. Normal sleep architecture is not observed.   Hyperventilation produced a mild to moderate buildup but failed to elicit any abnormalities. Intermittent photic stimulation was performed but failed to illicit any change in the tracing.     IMPRESSION: Normal drowsy EEG. A normal EEG does not rule out the possibility of an underlying seizure disorder.    Jim Like, DO [facility] [phone]  If 7pm- 7am, please page neurology on call as listed in [place]. 06/20/2014, 4:52 PM

## 2014-06-20 NOTE — ED Notes (Signed)
Pt wanded by security. 

## 2014-06-20 NOTE — ED Notes (Signed)
Updated mother, Izora Gala, on pt status per pt request - okay to contact at [phone].

## 2014-06-20 NOTE — Progress Notes (Signed)
EEG Completed; Results Pending  

## 2014-06-21 ENCOUNTER — Encounter (HOSPITAL_COMMUNITY): Payer: Self-pay | Admitting: General Practice

## 2014-06-21 DIAGNOSIS — T43212A Poisoning by selective serotonin and norepinephrine reuptake inhibitors, intentional self-harm, initial encounter: Principal | ICD-10-CM

## 2014-06-21 DIAGNOSIS — T421X1A Poisoning by iminostilbenes, accidental (unintentional), initial encounter: Secondary | ICD-10-CM

## 2014-06-21 DIAGNOSIS — F1010 Alcohol abuse, uncomplicated: Secondary | ICD-10-CM

## 2014-06-21 DIAGNOSIS — T1491 Suicide attempt: Secondary | ICD-10-CM

## 2014-06-21 DIAGNOSIS — T421X2A Poisoning by iminostilbenes, intentional self-harm, initial encounter: Secondary | ICD-10-CM

## 2014-06-21 DIAGNOSIS — T421X2D Poisoning by iminostilbenes, intentional self-harm, subsequent encounter: Secondary | ICD-10-CM

## 2014-06-21 DIAGNOSIS — F319 Bipolar disorder, unspecified: Secondary | ICD-10-CM

## 2014-06-21 LAB — COMPREHENSIVE METABOLIC PANEL
ALT: 14 U/L (ref 0–35)
AST: 18 U/L (ref 0–37)
Albumin: 3 g/dL — ABNORMAL LOW (ref 3.5–5.2)
Alkaline Phosphatase: 40 U/L (ref 39–117)
Anion gap: 8 (ref 5–15)
BUN: 9 mg/dL (ref 6–23)
CO2: 24 mmol/L (ref 19–32)
Calcium: 8.1 mg/dL — ABNORMAL LOW (ref 8.4–10.5)
Chloride: 105 mmol/L (ref 96–112)
Creatinine, Ser: 0.57 mg/dL (ref 0.50–1.10)
GFR calc Af Amer: >90 mL/min (ref >90)
GFR calc non Af Amer: >90 mL/min (ref >90)
Glucose, Bld: 89 mg/dL (ref 70–99)
Potassium: 3.7 mmol/L (ref 3.5–5.1)
Sodium: 137 mmol/L (ref 135–145)
Total Bilirubin: 0.5 mg/dL (ref 0.3–1.2)
Total Protein: 5.2 g/dL — ABNORMAL LOW (ref 6.0–8.3)

## 2014-06-21 LAB — CARBAMAZEPINE LEVEL, TOTAL
CARBAMAZEPINE LVL: 16.9 ug/mL — AB (ref 4.0–12.0)
CARBAMAZEPINE LVL: 14.8 ug/mL — AB (ref 4.0–12.0)

## 2014-06-21 MED ORDER — DIPHENHYDRAMINE HCL 50 MG/ML IJ SOLN
25.0000 mg | Freq: Four times a day (QID) | INTRAMUSCULAR | Status: DC | PRN
  Administered 2014-06-21 – 2014-06-23 (×4): 25 mg via INTRAVENOUS
  Filled 2014-06-21 (×5): qty 1

## 2014-06-21 NOTE — Progress Notes (Signed)
PATIENT DETAILS Name: Janet Lewis Age: 44 y.o. Sex: female Date of Birth: 14-Sep-1970 Admit Date: 06/20/2014 Admitting Physician No admitting provider for patient encounter. ZOX:[id number], MD  Subjective: No major complaints-feels overall much better.   Assessment/Plan: Principal Problem:   Tegretol toxicity due to overdose: Admitted and provided with supportive care. Poison control consulted. Tegretol levels now almost normalized. Per RN, no further recommendations from poison control. Instability, and mild clonus seems to have improved. Although not suicidal when she overdosed, very impulsive, ongoing alcohol issues as well. Therefore have consulted psychiatry. Continue with bedside sitter.  Active Problems:   Alcohol dependence: Claims she binged 2 days back, claims that this was her first alcohol use in the past 2-3 weeks. Currently no signs of withdrawal. Continue Ativan per [place] protocol.    Bipolar disorder: Mode currently seems stable. Await further input from psychiatry. Tegretol, Wellbutrin and trazodone remain on hold    GERD:Stable, c/w PPI  Disposition: Remain inpatient  Antibiotics:  See below   Anti-infectives    None      DVT Prophylaxis: Prophylactic Lovenox   Code Status: Full code  Family Communication None at bedside  Procedures:  None  CONSULTS:  neurology and psychiatry  Time spent 40 minutes-which includes 50% of the time with face-to-face with patient/ family and coordinating care related to the above assessment and plan.  MEDICATIONS: Scheduled Meds: . enoxaparin (LOVENOX) injection  40 mg Subcutaneous Q24H  . folic acid  1 mg Oral Daily  . multivitamin with minerals  1 tablet Oral Daily  . pantoprazole  40 mg Oral Daily  . sodium chloride  3 mL Intravenous Q12H  . thiamine  100 mg Oral Daily   Continuous Infusions: . sodium chloride 75 mL/hr at 06/21/14 0904   PRN Meds:.acetaminophen **OR** acetaminophen,  clonazePAM, diphenhydrAMINE, LORazepam **OR** LORazepam, ondansetron **OR** ondansetron (ZOFRAN) IV    PHYSICAL EXAM: Vital signs in last 24 hours: Filed Vitals:   06/20/14 1846 06/21/14 0003 06/21/14 0611 06/21/14 1144  BP:  114/62 105/51 131/80  Pulse: 85 83 86 87  Temp:  98.3 F (36.8 C) 98.4 F (36.9 C) 98.8 F (37.1 C)  TempSrc:  Oral Oral Oral  Resp:   13 18  Height:      Weight:      SpO2: 94% 96% 95% 100%    Weight change:  Filed Weights   06/20/14 1415  Weight: 70.534 kg (155 lb 8 oz)   Body mass index is 25.11 kg/(m^2).   Gen Exam: Awake and alert with clear speech.   Neck: Supple, No JVD.   Chest: B/L Clear.   CVS: S1 S2 Regular, no murmurs.  Abdomen: soft, BS +, non tender, non distended.  Extremities: no edema, lower extremities warm to touch. Neurologic: Non Focal.   Skin: No Rash.   Wounds: N/A.    Intake/Output from previous day:  Intake/Output Summary (Last 24 hours) at 06/21/14 1334 Last data filed at 06/21/14 1131  Gross per 24 hour  Intake 1001.33 ml  Output   1400 ml  Net -398.67 ml     LAB RESULTS: CBC  Recent Labs Lab 06/20/14 0855  WBC 10.4  HGB 11.9*  HCT 36.0  PLT 247  MCV 100.3*  MCH 33.1  MCHC 33.1  RDW 14.0  LYMPHSABS 0.5*  MONOABS 0.7  EOSABS 0.0  BASOSABS 0.0    Chemistries   Recent Labs Lab 06/20/14 0855 06/21/14 0451  NA 135  137  K 3.8 3.7  CL 101 105  CO2 24 24  GLUCOSE 99 89  BUN 16 9  CREATININE 0.65 0.57  CALCIUM 8.3* 8.1*  MG 1.7  --     CBG: No results for input(s): GLUCAP in the last 168 hours.  GFR Estimated Creatinine Clearance: 85.8 mL/min (by C-G formula based on Cr of 0.57).  Coagulation profile No results for input(s): INR, PROTIME in the last 168 hours.  Cardiac Enzymes No results for input(s): CKMB, TROPONINI, MYOGLOBIN in the last 168 hours.  Invalid input(s): CK  Invalid input(s): POCBNP No results for input(s): DDIMER in the last 72 hours. No results for input(s):  HGBA1C in the last 72 hours. No results for input(s): CHOL, HDL, LDLCALC, TRIG, CHOLHDL, LDLDIRECT in the last 72 hours. No results for input(s): TSH, T4TOTAL, T3FREE, THYROIDAB in the last 72 hours.  Invalid input(s): FREET3 No results for input(s): VITAMINB12, FOLATE, FERRITIN, TIBC, IRON, RETICCTPCT in the last 72 hours. No results for input(s): LIPASE, AMYLASE in the last 72 hours.  Urine Studies No results for input(s): UHGB, CRYS in the last 72 hours.  Invalid input(s): UACOL, UAPR, USPG, UPH, UTP, UGL, UKET, UBIL, UNIT, UROB, ULEU, UEPI, UWBC, URBC, UBAC, CAST, UCOM, BILUA  MICROBIOLOGY: No results found for this or any previous visit (from the past 240 hour(s)).  RADIOLOGY STUDIES/RESULTS: Ct Head Wo Contrast  06/20/2014   CLINICAL DATA:  Fall with tremors and jerking. History of alcoholism.  EXAM: CT HEAD WITHOUT CONTRAST  CT CERVICAL SPINE WITHOUT CONTRAST  TECHNIQUE: Multidetector CT imaging of the head and cervical spine was performed following the standard protocol without intravenous contrast. Multiplanar CT image reconstructions of the cervical spine were also generated.  COMPARISON:  None.  FINDINGS: CT HEAD FINDINGS  The brain demonstrates no evidence of hemorrhage, infarction, edema, mass effect, extra-axial fluid collection, hydrocephalus or mass lesion. The skull is unremarkable.  CT CERVICAL SPINE FINDINGS  The cervical spine shows normal alignment. There is no evidence of acute fracture or subluxation. No soft tissue swelling or hematoma is identified. Mild cervical spondylosis at C6-7. No bony or soft tissue lesions are seen. The visualized airway is normally patent.  IMPRESSION: No acute findings by head CT or cervical CT. Mild cervical spondylosis at C6-7.   Electronically Signed   By: Aletta Edouard M.D.   On: 06/20/2014 09:45   Ct Cervical Spine Wo Contrast  06/20/2014   CLINICAL DATA:  Fall with tremors and jerking. History of alcoholism.  EXAM: CT HEAD WITHOUT  CONTRAST  CT CERVICAL SPINE WITHOUT CONTRAST  TECHNIQUE: Multidetector CT imaging of the head and cervical spine was performed following the standard protocol without intravenous contrast. Multiplanar CT image reconstructions of the cervical spine were also generated.  COMPARISON:  None.  FINDINGS: CT HEAD FINDINGS  The brain demonstrates no evidence of hemorrhage, infarction, edema, mass effect, extra-axial fluid collection, hydrocephalus or mass lesion. The skull is unremarkable.  CT CERVICAL SPINE FINDINGS  The cervical spine shows normal alignment. There is no evidence of acute fracture or subluxation. No soft tissue swelling or hematoma is identified. Mild cervical spondylosis at C6-7. No bony or soft tissue lesions are seen. The visualized airway is normally patent.  IMPRESSION: No acute findings by head CT or cervical CT. Mild cervical spondylosis at C6-7.   Electronically Signed   By: Aletta Edouard M.D.   On: 06/20/2014 09:45    Oren Binet, MD  [facility] Pager:[phone]  If 7PM-7AM, please  contact night-coverage [email or web address] Password [email or web address] 06/21/2014, 1:34 PM   LOS: 1 day

## 2014-06-21 NOTE — Progress Notes (Signed)
UR COMPLETED  

## 2014-06-21 NOTE — Clinical Social Work Psych Note (Signed)
Psych CSW with with psychiatrist and patient.  Full assessment to follow.  Nonnie Done, [place] [phone]  Psychiatric & Orthopedics (5N 1-16) Clinical Social Worker

## 2014-06-21 NOTE — Progress Notes (Signed)
Spoke w/ Gena from [employer].  Was informed that it was no longer neecessary to repeat tegretol levels since they were trending down.  EKG yesterday showed NSR w/ PVCs.  Pt was SR on telemetry monitor.  Per Gena, a repeat EKG is not necessary and pt appears to be stable and back to baseline.  Will continue to monitor.

## 2014-06-21 NOTE — Progress Notes (Signed)
Subjective: Still feeling "itchy".  Myoclonus improved but still present at times when sitting up or eating.     Objective: Current vital signs: BP 131/80 mmHg  Pulse 87  Temp(Src) 98.8 F (37.1 C) (Oral)  Resp 18  Ht 5' 6"  (1.676 m)  Wt 70.534 kg (155 lb 8 oz)  BMI 25.11 kg/m2  SpO2 100% Vital signs in last 24 hours: Temp:  [98.3 F (36.8 C)-98.9 F (37.2 C)] 98.8 F (37.1 C) (02/25 1144) Pulse Rate:  [83-98] 87 (02/25 1144) Resp:  [12-18] 18 (02/25 1144) BP: (101-131)/(51-80) 131/80 mmHg (02/25 1144) SpO2:  [92 %-100 %] 100 % (02/25 1144) Weight:  [70.534 kg (155 lb 8 oz)] 70.534 kg (155 lb 8 oz) (02/24 1415)  Intake/Output from previous day: 02/24 0701 - 02/25 0700 In: 761.3 [P.O.:360; I.V.:401.3] Out: 800 [Urine:800] Intake/Output this shift: Total I/O In: 240 [P.O.:240] Out: 600 [Urine:600] Nutritional status: Diet regular  Neurologic Exam: General: Mental Status: Alert, oriented, thought content appropriate.  Speech fluent without evidence of aphasia.  Able to follow 3 step commands without difficulty. Cranial Nerves: II: Discs flat bilaterally; Visual fields grossly normal, pupils equal, round, reactive to light and accommodation III,IV, VI: ptosis not present, extra-ocular motions intact bilaterally V,VII: smile symmetric, facial light touch sensation normal bilaterally VIII: hearing normal bilaterally IX,X: gag reflex present XI: bilateral shoulder shrug XII: midline tongue extension without atrophy or fasciculations  Motor: Right : Upper extremity   5/5    Left:     Upper extremity   5/5  Lower extremity   5/5     Lower extremity   5/5 Tone and bulk:normal tone throughout; no atrophy noted Sensory: Pinprick and light touch intact throughout, bilaterally Deep Tendon Reflexes:  Right: Upper Extremity   Left: Upper extremity   biceps (C-5 to C-6) 2/4   biceps (C-5 to C-6) 2/4 tricep (C7) 2/4    triceps (C7) 2/4 Brachioradialis (C6) 2/4  Brachioradialis  (C6) 2/4  Lower Extremity Lower Extremity  quadriceps (L-2 to L-4) 0/4   quadriceps (L-2 to L-4) 0/4 Achilles (S1) 0/4   Achilles (S1) 0/4  Plantars: Right: downgoing   Left: downgoing Cerebellar: normal finger-to-nose,  normal heel-to-shin testing bilaterally   Lab Results: Basic Metabolic Panel:  Recent Labs Lab 06/20/14 0855 06/21/14 0451  NA 135 137  K 3.8 3.7  CL 101 105  CO2 24 24  GLUCOSE 99 89  BUN 16 9  CREATININE 0.65 0.57  CALCIUM 8.3* 8.1*  MG 1.7  --     Liver Function Tests:  Recent Labs Lab 06/20/14 0855 06/21/14 0451  AST 22 18  ALT 17 14  ALKPHOS 46 40  BILITOT 0.6 0.5  PROT 6.2 5.2*  ALBUMIN 3.7 3.0*   No results for input(s): LIPASE, AMYLASE in the last 168 hours. No results for input(s): AMMONIA in the last 168 hours.  CBC:  Recent Labs Lab 06/20/14 0855  WBC 10.4  NEUTROABS 9.1*  HGB 11.9*  HCT 36.0  MCV 100.3*  PLT 247    Cardiac Enzymes: No results for input(s): CKTOTAL, CKMB, CKMBINDEX, TROPONINI in the last 168 hours.  Lipid Panel: No results for input(s): CHOL, TRIG, HDL, CHOLHDL, VLDL, LDLCALC in the last 168 hours.  CBG: No results for input(s): GLUCAP in the last 168 hours.  Microbiology: No results found for this or any previous visit.  Coagulation Studies: No results for input(s): LABPROT, INR in the last 72 hours.  Imaging: Ct Head Wo Contrast  06/20/2014   CLINICAL DATA:  Fall with tremors and jerking. History of alcoholism.  EXAM: CT HEAD WITHOUT CONTRAST  CT CERVICAL SPINE WITHOUT CONTRAST  TECHNIQUE: Multidetector CT imaging of the head and cervical spine was performed following the standard protocol without intravenous contrast. Multiplanar CT image reconstructions of the cervical spine were also generated.  COMPARISON:  None.  FINDINGS: CT HEAD FINDINGS  The brain demonstrates no evidence of hemorrhage, infarction, edema, mass effect, extra-axial fluid collection, hydrocephalus or mass lesion. The skull  is unremarkable.  CT CERVICAL SPINE FINDINGS  The cervical spine shows normal alignment. There is no evidence of acute fracture or subluxation. No soft tissue swelling or hematoma is identified. Mild cervical spondylosis at C6-7. No bony or soft tissue lesions are seen. The visualized airway is normally patent.  IMPRESSION: No acute findings by head CT or cervical CT. Mild cervical spondylosis at C6-7.   Electronically Signed   By: Aletta Edouard M.D.   On: 06/20/2014 09:45   Ct Cervical Spine Wo Contrast  06/20/2014   CLINICAL DATA:  Fall with tremors and jerking. History of alcoholism.  EXAM: CT HEAD WITHOUT CONTRAST  CT CERVICAL SPINE WITHOUT CONTRAST  TECHNIQUE: Multidetector CT imaging of the head and cervical spine was performed following the standard protocol without intravenous contrast. Multiplanar CT image reconstructions of the cervical spine were also generated.  COMPARISON:  None.  FINDINGS: CT HEAD FINDINGS  The brain demonstrates no evidence of hemorrhage, infarction, edema, mass effect, extra-axial fluid collection, hydrocephalus or mass lesion. The skull is unremarkable.  CT CERVICAL SPINE FINDINGS  The cervical spine shows normal alignment. There is no evidence of acute fracture or subluxation. No soft tissue swelling or hematoma is identified. Mild cervical spondylosis at C6-7. No bony or soft tissue lesions are seen. The visualized airway is normally patent.  IMPRESSION: No acute findings by head CT or cervical CT. Mild cervical spondylosis at C6-7.   Electronically Signed   By: Aletta Edouard M.D.   On: 06/20/2014 09:45    Medications:  Scheduled: . enoxaparin (LOVENOX) injection  40 mg Subcutaneous Q24H  . folic acid  1 mg Oral Daily  . multivitamin with minerals  1 tablet Oral Daily  . pantoprazole  40 mg Oral Daily  . sodium chloride  3 mL Intravenous Q12H  . thiamine  100 mg Oral Daily   Etta Quill PA-C [facility] [phone]  06/21/2014, 12:19 PM  Patient  seen and examined.  Clinical course and management discussed.  Necessary edits performed.  I agree with the above.  Assessment and plan of care developed and discussed below.  Assessment/Plan: 44 year old female s/p overdose with Tegretol and Trazodone. Has had ETOH in excess as well. Presenting with signs of  toxicity with gait instability and myoclonus. Tegretol level trending down with latest level of 14.8. Myoclonus markedly improved.  EEG showed no epileptiform activity.  Psych consult pending.  Last Klonopin dose was yesterday afternoon.  Recommendations: 1.  Agree with current management.  Would continue to follow Tegretol levels and restart medications based on psych recommendations     Alexis Goodell, MD [facility] [phone]  06/21/2014  1:44 PM

## 2014-06-21 NOTE — Progress Notes (Signed)
CRITICAL VALUE ALERT  Critical value received:  Carbamazepine 16.9  Date of notification:  2/25/20116  Time of notification:  0530  Critical value read back:Yes.    Nurse who received alert:  [place]  MD notified (1st page): Dr, Hilbert Bible   Time of first page:  [phone]

## 2014-06-21 NOTE — Consult Note (Signed)
[facility] Consult   Reason for Consult:  Intentional overdose of psych Medication, bipolar and alcohol abuse Referring Physician:  Dr. Sloan Leiter Patient Identification: Janet Lewis MRN:  [id number] Principal Diagnosis: Tegretol toxicity Diagnosis:   Patient Active Problem List   Diagnosis Date Noted  . Tegretol toxicity [T42.1X1A] 06/20/2014  . Drug overdose [T50.901A] 06/20/2014  . Bipolar disorder [F31.9] 06/20/2014  . Drug overdose, intentional [T50.902A] 06/20/2014  . Alcohol dependence [F10.20] 06/13/2012  . 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 female patient admitted with CBZ toxicity.  HPI:  [name] is a 44 year old female seen and chart reviewed for psychiatric consultation and evaluation of intentional overdose of trazodone and carbamazepine followed by an argument/conflict with her husband. Patient reported she has been suffering with the bipolar disorder and alcohol dependence over 10 years and also has been multiple alcohol detox treatments and rehabilitation services in the past. Patient stated she relapsed drinking vodka, and her husband came with breath analyzer to check on her which started conflict between them. Patient also reported she has been noncompliant with her psychiatric medications on and off and not honest with her psychiatric provider. Reportedly she was seen her psychiatric provider 2 days ago and received a one-month supply of the medication and has taken half of medication with intention to hurt herself. Patient reported she does not have any intention to kill herself during his emergency room evaluation. Patient is psychiatrically not stable and medically is also not stable at this current time due to toxic levels of carbamazepine. Patient has a 3 children (42, 59 and 11) and 2 younger lives at home. 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  Medical history: Patient is a alcoholic female with history of bipolar disorder, with inpatient alcohol detox in December was brought to the ED for overdose of carbamazepine and trazodone . Patient drinks 1 pint of 4 times daily and reports trying to quit for past 1 week until yesterday when she again started drinking since the morning. She had confrontation with her husband late in the day and took a handful of trazodone and Tegretol. She reports taking the medications in anger and did not intend to kill herself. Denies suicidal ideation at this time. She had an episode of vomiting in the evening but went off to sleep. This morning when she got up to go to work she was very unsteady and fell 3 times at home. She reports having some headache and some dizziness. Denies headache, dizziness, fever, chills, chest pain, palpitations, SOB, abdominal pain, bowel or urinary symptoms. Denies change in weight or appetite. Denies history of alcohol withdrawal symptoms. Denies any other drug use.  Course in the ED   patient's vitals were stable. Blood will done showed the Ceftin 0.4, hemoglobin of 11.9, hematocrit of 36, platelets of 247, sodium of 135, potassium 3.8, chloride 11, CO2 24, normal renal function and glucose. Head CT and CT of the cervical spine were unremarkable. Carbamazepine level was elevated to 26. Neuro hospitalist was consulted who recommended admission under observation with supportive care. Poison control was consulted by ED physician who recommended monitoring her carbamazepine level every 6 hours until normal. Hospitalist admission requested to telemetry.  Review of Systems:  Constitutional:  Denies fever, chills, diaphoresis, appetite change and fatigue.  HEENT: Denies photophobia, eye pain, hearing loss, ear pain, congestion, , trouble  swallowing, neck pain, neck stiffness and tinnitus.  Respiratory: Denies SOB, DOE, cough, chest tightness, and wheezing.  Cardiovascular: Denies chest pain, palpitations and leg swelling.  Gastrointestinal: nausea, vomiting, denies abdominal pain, diarrhea, constipation, blood in stool and abdominal distention.  Genitourinary: Denies dysuria, urgency, frequency, hematuria, flank pain and difficulty urinating.  Endocrine: Denies: hot or cold intolerance, sweats, , polyuria, polydipsia. Musculoskeletal: Denies myalgias, back pain, joint swelling, arthralgias , unsteady gait Skin: Denies pallor, rash and wound.  Neurological: Headache, dizziness, unsteady gait Denies dizziness, seizures, syncope, weakness, light-headedness, numbness and headaches.  Hematological: Denies adenopathy.  Psychiatric/Behavioral: Mood changes++, Denies suicidal ideation, confusion, nervousness, sleep disturbance and agitation  HPI Elements:   Location:  Bipolar disorder and substance abuse. Quality:  Poor. Severity:  Intentional overdose. Timing:  Conflict with husband who has been monitoring her alcohol intake. Duration:  Few days. Context:  Patient need crisis stabilization and medication management and safety monitoring.  Past Medical History:  Past Medical History  Diagnosis Date  . Bipolar disorder     Past Surgical History  Procedure Laterality Date  . Breast surgery  2008    implants   Family History:  Family History  Problem Relation Age of Onset  . Heart failure Mother   . Hypertension Mother   . Hypertension Father    Social History:  History  Alcohol Use  . 4.8 oz/week  . 8 Glasses of wine per week     History  Drug Use No    History   Social History  . Marital Status: Married    Spouse Name: N/A  . Number of Children: N/A  . Years of Education: N/A   Social History Main Topics  . Smoking status: Never Smoker   . Smokeless tobacco: Never Used  . Alcohol Use: 4.8 oz/week     8 Glasses of wine per week  . Drug Use: No  . Sexual Activity: Yes     Comment: husband had vasectomy   Other Topics Concern  . Not on file   Social History Narrative   Additional Social History:                          Allergies:   Allergies  Allergen Reactions  . Codeine Itching    Pt reports allergic reaction of itching to "all pain medications"  . Sulfa Antibiotics Hives  . Neosporin [Neomycin-Bacitracin Zn-Polymyx] Rash    Vitals: Blood pressure 105/51, pulse 86, temperature 98.4 F (36.9 C), temperature source Oral, resp. rate 13, height 5' 6"  (1.676 m), weight 70.534 kg (155 lb 8 oz), SpO2 95 %.  Risk to Self: Is patient at risk for suicide?: No Risk to Others:   Prior Inpatient Therapy:   Prior Outpatient Therapy:    Current Facility-Administered Medications  Medication Dose Route Frequency Provider Last Rate Last Dose  . 0.9 %  sodium chloride infusion   Intravenous Continuous Nishant Dhungel, MD 75 mL/hr at 06/21/14 0904    . acetaminophen (TYLENOL) tablet 650 mg  650 mg Oral Q6H PRN Nishant Dhungel, MD       Or  . acetaminophen (TYLENOL) suppository 650 mg  650 mg Rectal Q6H PRN Nishant Dhungel, MD      . clonazePAM (KLONOPIN) tablet 0.5 mg  0.5 mg Oral BID PRN Alexis Goodell, MD   0.5  mg at 06/20/14 1535  . diphenhydrAMINE (BENADRYL) injection 12.5 mg  12.5 mg Intravenous Q8H PRN Nishant Dhungel, MD   12.5 mg at 06/20/14 1802  . enoxaparin (LOVENOX) injection 40 mg  40 mg Subcutaneous Q24H Nishant Dhungel, MD   40 mg at 06/20/14 2222  . folic acid (FOLVITE) tablet 1 mg  1 mg Oral Daily Nishant Dhungel, MD   1 mg at 06/21/14 0904  . LORazepam (ATIVAN) tablet 1 mg  1 mg Oral Q6H PRN Louellen Molder, MD       Or  . LORazepam (ATIVAN) injection 1 mg  1 mg Intravenous Q6H PRN Nishant Dhungel, MD      . multivitamin with minerals tablet 1 tablet  1 tablet Oral Daily Nishant Dhungel, MD   1 tablet at 06/21/14 0904  . ondansetron (ZOFRAN) tablet 4 mg  4  mg Oral Q6H PRN Nishant Dhungel, MD       Or  . ondansetron (ZOFRAN) injection 4 mg  4 mg Intravenous Q6H PRN Nishant Dhungel, MD      . pantoprazole (PROTONIX) EC tablet 40 mg  40 mg Oral Daily Nishant Dhungel, MD   40 mg at 06/21/14 0904  . sodium chloride 0.9 % injection 3 mL  3 mL Intravenous Q12H Nishant Dhungel, MD   3 mL at 06/20/14 2222  . thiamine (VITAMIN B-1) tablet 100 mg  100 mg Oral Daily Nishant Dhungel, MD   100 mg at 06/21/14 0904    Musculoskeletal: Strength & Muscle Tone: decreased Gait & Station: unable to stand Patient leans: N/A  Psychiatric Specialty Exam: Physical Exam as per history and physical   ROS depression, anxiety, self harming behavior and alcohol intoxication   Blood pressure 105/51, pulse 86, temperature 98.4 F (36.9 C), temperature source Oral, resp. rate 13, height 5' 6"  (1.676 m), weight 70.534 kg (155 lb 8 oz), SpO2 95 %.Body mass index is 25.11 kg/(m^2).  General Appearance: Guarded  Eye Contact::  Fair  Speech:  Clear and Coherent and Slow  Volume:  Decreased  Mood:  Depressed, Dysphoric and Hopeless  Affect:  Labile  Thought Process:  Coherent and Goal Directed  Orientation:  Full (Time, Place, and Person)  Thought Content:  WDL  Suicidal Thoughts:  No status post intentional drug overdose   Homicidal Thoughts:  No  Memory:  Immediate;   Fair Recent;   Fair  Judgement:  Impaired  Insight:  Shallow  Psychomotor Activity:  Psychomotor Retardation  Concentration:  Fair  Recall:  Good  Fund of Knowledge:Good  Language: Good  Akathisia:  NA  Handed:  Right  AIMS (if indicated):     Assets:  Communication Skills Desire for Improvement Financial Resources/Insurance Housing Intimacy Leisure Time Physical Health Resilience Social Support Talents/Skills Transportation  ADL's:  Intact  Cognition: WNL  Sleep:      Medical Decision Making: Review of Psycho-Social Stressors (1), Review or order clinical lab tests (1), Decision to  obtain old records (1), Established Problem, Worsening (2), Review or order medicine tests (1), Review of Medication Regimen & Side Effects (2) and Review of New Medication or Change in Dosage (2)  Treatment Plan Summary: Daily contact with patient to assess and evaluate symptoms and progress in treatment and Medication management  Plan:  Monitor for the carbamazepine toxicity as recommended Recommend psychiatric Inpatient admission when medically cleared. Supportive therapy provided about ongoing stressors.  Appreciate psychiatric consultation and follow up as clinically required Please contact [phone] or [phone] if  needs further assistance Psychiatric social service follow-up with patient regarding appropriate inpatient psychiatric placement Patient may need involuntary commitment if she refuses inpatient treatment  Disposition: Inpatient psychiatric hospitalization for crisis stabilization, safety monitoring on medication management.  Daelin Haste,JANARDHAHA R. 06/21/2014 9:53 AM

## 2014-06-22 ENCOUNTER — Inpatient Hospital Stay: Payer: Self-pay

## 2014-06-22 DIAGNOSIS — T50901A Poisoning by unspecified drugs, medicaments and biological substances, accidental (unintentional), initial encounter: Secondary | ICD-10-CM

## 2014-06-22 LAB — COMPREHENSIVE METABOLIC PANEL
ALT: 14 U/L (ref 0–35)
AST: 19 U/L (ref 0–37)
Albumin: 3.3 g/dL — ABNORMAL LOW (ref 3.5–5.2)
Alkaline Phosphatase: 46 U/L (ref 39–117)
Anion gap: 4 — ABNORMAL LOW (ref 5–15)
BILIRUBIN TOTAL: 0.4 mg/dL (ref 0.3–1.2)
CO2: 28 mmol/L (ref 19–32)
Calcium: 8.4 mg/dL (ref 8.4–10.5)
Chloride: 105 mmol/L (ref 96–112)
Creatinine, Ser: 0.64 mg/dL (ref 0.50–1.10)
GFR calc Af Amer: >90 mL/min (ref >90)
GFR calc non Af Amer: >90 mL/min (ref >90)
Glucose, Bld: 93 mg/dL (ref 70–99)
Potassium: 3.4 mmol/L — ABNORMAL LOW (ref 3.5–5.1)
Sodium: 137 mmol/L (ref 135–145)
TOTAL PROTEIN: 6 g/dL (ref 6.0–8.3)

## 2014-06-22 LAB — CARBAMAZEPINE LEVEL, TOTAL: CARBAMAZEPINE LVL: 5.9 ug/mL (ref 4.0–12.0)

## 2014-06-22 LAB — CBC
HCT: 37 % (ref 36.0–46.0)
HEMOGLOBIN: 12.3 g/dL (ref 12.0–15.0)
MCH: 33.7 pg (ref 26.0–34.0)
MCHC: 33.2 g/dL (ref 30.0–36.0)
MCV: 101.4 fL — ABNORMAL HIGH (ref 78.0–100.0)
Platelets: 276 10*3/uL (ref 150–400)
RBC: 3.65 MIL/uL — AB (ref 3.87–5.11)
RDW: 13.7 % (ref 11.5–15.5)
WBC: 4.7 10*3/uL (ref 4.0–10.5)

## 2014-06-22 MED ORDER — POTASSIUM CHLORIDE CRYS ER 20 MEQ PO TBCR
40.0000 meq | EXTENDED_RELEASE_TABLET | Freq: Once | ORAL | Status: AC
  Administered 2014-06-22: 40 meq via ORAL
  Filled 2014-06-22: qty 2

## 2014-06-22 NOTE — Clinical Social Work Note (Signed)
CSW received call from [facility] about patient requesting to "speak with social worker." RNCM unsure what patient needs to see CSW for. This CSW has not been involved with patient's care and I assume she would like to speak with Psych CSW. CSW unable to see patient today but has placed patient on list of patient's to see and left report for weekend CSW to check in if possible.   Liz Beach MSW, [place], [place], [phone]

## 2014-06-22 NOTE — Progress Notes (Signed)
PATIENT DETAILS Name: Janet Lewis Age: 44 y.o. Sex: female Date of Birth: 11/11/1970 Admit Date: 06/20/2014 Admitting Physician No admitting provider for patient encounter. VEH:MCNOBSJ,GGEZMO A, MD  Subjective: Much better. Complains of generalized itching.   Assessment/Plan: Principal Problem:   Tegretol toxicity due to overdose: Admitted and provided with supportive care. Poison control consulted. Tegretol levels now  normalized. Per RN, no further recommendations from poison control. Instability, and mild clonus seems to have resolved. Although not suicidal when she overdosed, very impulsive, ongoing alcohol issues as well. Therefore consulted psychiatry-recommendations are to transfer to inpatient psych. Patient stable for transfer.  Continue with bedside sitter.  Active Problems:   Alcohol dependence: Claims she binged 2 days prior to admission, claims that this was her first alcohol use in the past 2-3 weeks. Currently no signs of withdrawal. Continue Ativan per [facility] protocol.    Bipolar disorder: Mode currently seems stable. Await further input from psychiatry. Tegretol, Wellbutrin and trazodone remain on hold    GERD:Stable, c/w PPI  Disposition: Remain inpatient-transfer to [facility] when bed available  Antibiotics:  See below   Anti-infectives    None      DVT Prophylaxis: Prophylactic Lovenox   Code Status: Full code  Family Communication None at bedside  Procedures:  None  CONSULTS:  neurology and psychiatry   MEDICATIONS: Scheduled Meds: . enoxaparin (LOVENOX) injection  40 mg Subcutaneous Q24H  . folic acid  1 mg Oral Daily  . multivitamin with minerals  1 tablet Oral Daily  . pantoprazole  40 mg Oral Daily  . sodium chloride  3 mL Intravenous Q12H  . thiamine  100 mg Oral Daily   Continuous Infusions:   PRN Meds:.acetaminophen **OR** acetaminophen, clonazePAM, diphenhydrAMINE, LORazepam **OR** LORazepam, ondansetron **OR** ondansetron  (ZOFRAN) IV    PHYSICAL EXAM: Vital signs in last 24 hours: Filed Vitals:   06/21/14 1854 06/22/14 0011 06/22/14 0632 06/22/14 1253  BP: 139/83 134/87 123/90 111/72  Pulse: 92 79 80 89  Temp: 99.1 F (37.3 C) 98.2 F (36.8 C) 98.2 F (36.8 C) 98.7 F (37.1 C)  TempSrc: Oral Oral Oral   Resp: 18 20 18 18   Height:      Weight:      SpO2: 99% 98% 98% 95%    Weight change:  Filed Weights   06/20/14 1415  Weight: 70.534 kg (155 lb 8 oz)   Body mass index is 25.11 kg/(m^2).   Gen Exam: Awake and alert with clear speech.   Neck: Supple, No JVD.   Chest: B/L Clear.   CVS: S1 S2 Regular, no murmurs.  Abdomen: soft, BS +, non tender, non distended.  Extremities: no edema, lower extremities warm to touch. Neurologic: Non Focal.   Skin: No Rash.   Wounds: N/A.    Intake/Output from previous day:  Intake/Output Summary (Last 24 hours) at 06/22/14 1409 Last data filed at 06/22/14 0525  Gross per 24 hour  Intake    240 ml  Output   2650 ml  Net  -2410 ml     LAB RESULTS: CBC  Recent Labs Lab 06/20/14 0855 06/22/14 0518  WBC 10.4 4.7  HGB 11.9* 12.3  HCT 36.0 37.0  PLT 247 276  MCV 100.3* 101.4*  MCH 33.1 33.7  MCHC 33.1 33.2  RDW 14.0 13.7  LYMPHSABS 0.5*  --   MONOABS 0.7  --   EOSABS 0.0  --   BASOSABS 0.0  --  Chemistries   Recent Labs Lab 06/20/14 0855 06/21/14 0451 06/22/14 0518  NA 135 137 137  K 3.8 3.7 3.4*  CL 101 105 105  CO2 24 24 28   GLUCOSE 99 89 93  BUN 16 9 <5*  CREATININE 0.65 0.57 0.64  CALCIUM 8.3* 8.1* 8.4  MG 1.7  --   --     CBG: No results for input(s): GLUCAP in the last 168 hours.  GFR Estimated Creatinine Clearance: 85.8 mL/min (by C-G formula based on Cr of 0.64).  Coagulation profile No results for input(s): INR, PROTIME in the last 168 hours.  Cardiac Enzymes No results for input(s): CKMB, TROPONINI, MYOGLOBIN in the last 168 hours.  Invalid input(s): CK  Invalid input(s): POCBNP No results for  input(s): DDIMER in the last 72 hours. No results for input(s): HGBA1C in the last 72 hours. No results for input(s): CHOL, HDL, LDLCALC, TRIG, CHOLHDL, LDLDIRECT in the last 72 hours. No results for input(s): TSH, T4TOTAL, T3FREE, THYROIDAB in the last 72 hours.  Invalid input(s): FREET3 No results for input(s): VITAMINB12, FOLATE, FERRITIN, TIBC, IRON, RETICCTPCT in the last 72 hours. No results for input(s): LIPASE, AMYLASE in the last 72 hours.  Urine Studies No results for input(s): UHGB, CRYS in the last 72 hours.  Invalid input(s): UACOL, UAPR, USPG, UPH, UTP, UGL, UKET, UBIL, UNIT, UROB, ULEU, UEPI, UWBC, URBC, UBAC, CAST, UCOM, BILUA  MICROBIOLOGY: No results found for this or any previous visit (from the past 240 hour(s)).  RADIOLOGY STUDIES/RESULTS: Ct Head Wo Contrast  06/20/2014   CLINICAL DATA:  Fall with tremors and jerking. History of alcoholism.  EXAM: CT HEAD WITHOUT CONTRAST  CT CERVICAL SPINE WITHOUT CONTRAST  TECHNIQUE: Multidetector CT imaging of the head and cervical spine was performed following the standard protocol without intravenous contrast. Multiplanar CT image reconstructions of the cervical spine were also generated.  COMPARISON:  None.  FINDINGS: CT HEAD FINDINGS  The brain demonstrates no evidence of hemorrhage, infarction, edema, mass effect, extra-axial fluid collection, hydrocephalus or mass lesion. The skull is unremarkable.  CT CERVICAL SPINE FINDINGS  The cervical spine shows normal alignment. There is no evidence of acute fracture or subluxation. No soft tissue swelling or hematoma is identified. Mild cervical spondylosis at C6-7. No bony or soft tissue lesions are seen. The visualized airway is normally patent.  IMPRESSION: No acute findings by head CT or cervical CT. Mild cervical spondylosis at C6-7.   Electronically Signed   By: Aletta Edouard M.D.   On: 06/20/2014 09:45   Ct Cervical Spine Wo Contrast  06/20/2014   CLINICAL DATA:  Fall with tremors  and jerking. History of alcoholism.  EXAM: CT HEAD WITHOUT CONTRAST  CT CERVICAL SPINE WITHOUT CONTRAST  TECHNIQUE: Multidetector CT imaging of the head and cervical spine was performed following the standard protocol without intravenous contrast. Multiplanar CT image reconstructions of the cervical spine were also generated.  COMPARISON:  None.  FINDINGS: CT HEAD FINDINGS  The brain demonstrates no evidence of hemorrhage, infarction, edema, mass effect, extra-axial fluid collection, hydrocephalus or mass lesion. The skull is unremarkable.  CT CERVICAL SPINE FINDINGS  The cervical spine shows normal alignment. There is no evidence of acute fracture or subluxation. No soft tissue swelling or hematoma is identified. Mild cervical spondylosis at C6-7. No bony or soft tissue lesions are seen. The visualized airway is normally patent.  IMPRESSION: No acute findings by head CT or cervical CT. Mild cervical spondylosis at C6-7.   Electronically Signed  By: Aletta Edouard M.D.   On: 06/20/2014 09:45    Oren Binet, MD  [facility] Pager:[phone]  If 7PM-7AM, please contact night-coverage [email or web address] Password [email or web address] 06/22/2014, 2:09 PM   LOS: 2 days

## 2014-06-22 NOTE — Consult Note (Signed)
Psychiatry Consult Followup  Reason for Consult:  Intentional overdose of psych Medication, bipolar and alcohol abuse Referring Physician:  Dr. Sloan Leiter Patient Identification: Janet Lewis MRN:  [id number] Principal Diagnosis: Tegretol toxicity Diagnosis:   Patient Active Problem List   Diagnosis Date Noted  . Alcohol abuse [F10.10]   . Tegretol toxicity [T42.1X1A] 06/20/2014  . Drug overdose [T50.901A] 06/20/2014  . Bipolar disorder [F31.9] 06/20/2014  . Drug overdose, intentional [T50.902A] 06/20/2014  . Alcohol dependence [F10.20] 06/13/2012  . Episodic mood disorder [F39] 06/13/2012    Total Time spent with patient: 20 minutes  Subjective:   Janet Lewis is a 44 y.o. female patient admitted with CBZ toxicity.  HPI:  Janet Lewis is a 44 year old female seen and chart reviewed for psychiatric consultation and evaluation of intentional overdose of trazodone and carbamazepine followed by an argument/conflict with her husband. Patient reported she has been suffering with the bipolar disorder and alcohol dependence over 10 years and also has been multiple alcohol detox treatments and rehabilitation services in the past. Patient stated she relapsed drinking vodka, and her husband came with breath analyzer to check on her which started conflict between them. Patient also reported she has been noncompliant with her psychiatric medications on and off and not honest with her psychiatric provider. Reportedly she was seen her psychiatric provider 2 days ago and received a one-month supply of the medication and has taken half of medication with intention to hurt herself. Patient reported she does not have any intention to kill herself during his emergency room evaluation. Patient is psychiatrically not stable and medically is also not stable at this current time due to toxic levels of carbamazepine. Patient has a 3 children (69, 67 and 11) and 2 younger lives at home. 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  Interval history: Patient has complained of generalized itching and her CBZ levels are no more toxic and continue to feel fine without distress or anxiety. She is able to sleep and eat okay and talking to family who is visiting her. She has no contact with her husband. She is willing to sign into inpatient psychiatric hospital and asking when can we find a bed. Psych social service informed me that she is working with several referral today. Patient has no alcohol withdrawal symptoms and denied cravings. She has no evidence of psychosis.  Medical history: Patient is a alcoholic female with history of bipolar disorder, with inpatient alcohol detox in December was brought to the ED for overdose of carbamazepine and trazodone . Patient drinks 1 pint of 4 times daily and reports trying to quit for past 1 week until yesterday when she again started drinking since the morning. She had confrontation with her husband late in the day and took a handful of trazodone and Tegretol. She reports taking the medications in anger and did not intend to kill herself. Denies suicidal ideation at this time. She had an episode of vomiting in the evening but went off to sleep. This morning when she got up to go to work she was very unsteady and fell 3 times at home. She reports having some headache and some dizziness. Denies headache, dizziness, fever, chills, chest pain, palpitations, SOB, abdominal pain, bowel or urinary symptoms. Denies change in weight or appetite. Denies history of alcohol withdrawal symptoms. Denies any other drug use.  Course in the ED : patient's vitals were stable. Blood will done showed the Ceftin 0.4, hemoglobin of 11.9, hematocrit of 36, platelets of 247, sodium of 135, potassium 3.8, chloride 11, CO2 24, normal renal  function and glucose. Head CT and CT of the cervical spine were unremarkable. Carbamazepine level was elevated to 26. Neuro hospitalist was consulted who recommended admission under observation with supportive care. Poison control was consulted by ED physician who recommended monitoring her carbamazepine level every 6 hours until normal. Hospitalist admission requested to telemetry.   Past Medical History:  Past Medical History  Diagnosis Date  . Bipolar disorder   . Anxiety     Past Surgical History  Procedure Laterality Date  . Augmentation mammaplasty Bilateral 2008  . Refractive surgery Bilateral ~ 2008   Family History:  Family History  Problem Relation Age of Onset  . Heart failure Mother   . Hypertension Mother   . Hypertension Father    Social History:  History  Alcohol Use  . 4.8 oz/week  . 8 Glasses of wine per week     History  Drug Use No    History   Social History  . Marital Status: Married    Spouse Name: N/A  . Number of Children: N/A  . Years of Education: N/A   Social History Main Topics  . Smoking status: Former Smoker -- 1.00 packs/day for 5 years    Types: Cigarettes    Quit date: 12/08/1992  . Smokeless tobacco: Never Used  . Alcohol Use: 4.8 oz/week    8 Glasses of wine per week  . Drug Use: No  . Sexual Activity: Yes     Comment: husband had vasectomy   Other Topics Concern  . None   Social History Narrative   Additional Social History:                          Allergies:   Allergies  Allergen Reactions  . Codeine Itching    Pt reports allergic reaction of itching to "all pain medications"  . Sulfa Antibiotics Hives  . Neosporin [Neomycin-Bacitracin Zn-Polymyx] Rash    Vitals: Blood pressure 111/72, pulse 89, temperature 98.7 F (37.1 C), temperature source Oral, resp. rate 18, height 5' 6"  (1.676 m), weight 70.534 kg (155 lb 8 oz), last menstrual period 05/31/2014, SpO2 95 %.  Risk to Self: Is patient at risk for  suicide?: No Risk to Others:   Prior Inpatient Therapy:   Prior Outpatient Therapy:    Current Facility-Administered Medications  Medication Dose Route Frequency Provider Last Rate Last Dose  . acetaminophen (TYLENOL) tablet 650 mg  650 mg Oral Q6H PRN Nishant Dhungel, MD       Or  . acetaminophen (TYLENOL) suppository 650 mg  650 mg Rectal Q6H PRN Nishant Dhungel, MD      . clonazePAM (KLONOPIN) tablet 0.5 mg  0.5 mg Oral BID PRN Alexis Goodell, MD   0.5 mg at 06/20/14 1535  . diphenhydrAMINE (BENADRYL) injection 25 mg  25 mg Intravenous Q6H PRN Jonetta Osgood, MD   25 mg at 06/22/14 5859  . enoxaparin (LOVENOX) injection 40 mg  40 mg Subcutaneous Q24H Nishant Dhungel, MD   40 mg at 06/21/14 2134  . folic acid (FOLVITE) tablet 1 mg  1 mg Oral Daily Nishant Dhungel, MD   1 mg at 06/22/14 0918  . LORazepam (ATIVAN) tablet 1 mg  1 mg Oral Q6H PRN Nishant  Dhungel, MD   1 mg at 06/21/14 1630   Or  . LORazepam (ATIVAN) injection 1 mg  1 mg Intravenous Q6H PRN Nishant Dhungel, MD      . multivitamin with minerals tablet 1 tablet  1 tablet Oral Daily Nishant Dhungel, MD   1 tablet at 06/22/14 0918  . ondansetron (ZOFRAN) tablet 4 mg  4 mg Oral Q6H PRN Nishant Dhungel, MD       Or  . ondansetron (ZOFRAN) injection 4 mg  4 mg Intravenous Q6H PRN Nishant Dhungel, MD      . pantoprazole (PROTONIX) EC tablet 40 mg  40 mg Oral Daily Nishant Dhungel, MD   40 mg at 06/22/14 0919  . sodium chloride 0.9 % injection 3 mL  3 mL Intravenous Q12H Nishant Dhungel, MD   3 mL at 06/22/14 0919  . thiamine (VITAMIN B-1) tablet 100 mg  100 mg Oral Daily Nishant Dhungel, MD   100 mg at 06/22/14 6546    Musculoskeletal: Strength & Muscle Tone: decreased Gait & Station: unable to stand Patient leans: N/A  Psychiatric Specialty Exam: Physical Exam as per history and physical   ROS depression, anxiety, self harming behavior and alcohol intoxication   Blood pressure 111/72, pulse 89, temperature 98.7 F (37.1  C), temperature source Oral, resp. rate 18, height 5' 6"  (1.676 m), weight 70.534 kg (155 lb 8 oz), last menstrual period 05/31/2014, SpO2 95 %.Body mass index is 25.11 kg/(m^2).  General Appearance: Guarded  Eye Contact::  Fair  Speech:  Clear and Coherent and Slow  Volume:  Decreased  Mood:  Depressed, Dysphoric and Hopeless  Affect:  Labile  Thought Process:  Coherent and Goal Directed  Orientation:  Full (Time, Place, and Person)  Thought Content:  WDL  Suicidal Thoughts:  No status post intentional drug overdose   Homicidal Thoughts:  No  Memory:  Immediate;   Fair Recent;   Fair  Judgement:  Impaired  Insight:  Shallow  Psychomotor Activity:  Psychomotor Retardation  Concentration:  Fair  Recall:  Good  Fund of Knowledge:Good  Language: Good  Akathisia:  NA  Handed:  Right  AIMS (if indicated):     Assets:  Communication Skills Desire for Improvement Financial Resources/Insurance Housing Intimacy Leisure Time Physical Health Resilience Social Support Talents/Skills Transportation  ADL's:  Intact  Cognition: WNL  Sleep:      Medical Decision Making: Review of Psycho-Social Stressors (1), Review or order clinical lab tests (1), Decision to obtain old records (1), Established Problem, Worsening (2), Review or order medicine tests (1), Review of Medication Regimen & Side Effects (2) and Review of New Medication or Change in Dosage (2)  Treatment Plan Summary: Daily contact with patient to assess and evaluate symptoms and progress in treatment and Medication management  Plan:  Carbamazepine level is 5.9 as this morning, so longer toxic at this time. Recommend psychiatric Inpatient admission when medically cleared. Supportive therapy provided about ongoing stressors.  Appreciate psychiatric consultation and follow up as clinically required Please contact [phone] or [phone] if needs further assistance Psychiatric social service follow-up with patient regarding  appropriate inpatient psychiatric placement Patient may need involuntary commitment if she refuses inpatient treatment  Disposition: Inpatient psychiatric hospitalization for crisis stabilization, safety monitoring on medication management.  Lavon Horn,JANARDHAHA R. 06/22/2014 2:48 PM

## 2014-06-22 NOTE — Progress Notes (Signed)
Subjective: Still has some tremor in her left hand but over all feeling better. Feels she is no longer having the significant "twitching". Continues to feel "itchy".  Objective: Current vital signs: BP 123/90 mmHg  Pulse 80  Temp(Src) 98.2 F (36.8 C) (Oral)  Resp 18  Ht 5' 6"  (1.676 m)  Wt 70.534 kg (155 lb 8 oz)  BMI 25.11 kg/m2  SpO2 98%  LMP 05/31/2014 Vital signs in last 24 hours: Temp:  [98.2 F (36.8 C)-99.1 F (37.3 C)] 98.2 F (36.8 C) (02/26 4401) Pulse Rate:  [79-92] 80 (02/26 0632) Resp:  [18-20] 18 (02/26 0272) BP: (123-139)/(80-90) 123/90 mmHg (02/26 0632) SpO2:  [98 %-100 %] 98 % (02/26 5366)  Intake/Output from previous day: 02/25 0701 - 02/26 0700 In: 1248.8 [P.O.:720; I.V.:528.8] Out: 3250 [Urine:3250] Intake/Output this shift:   Nutritional status: Diet regular  Neurologic Exam: Mental Status: Alert, oriented, thought content appropriate. Speech fluent without evidence of aphasia. Able to follow 3 step commands without difficulty. Cranial Nerves: II: Visual fields grossly normal, pupils equal, round, reactive to light and accommodation III,IV, VI: ptosis not present, extra-ocular motions intact bilaterally V,VII: smile symmetric, facial light touch sensation normal bilaterally VIII: hearing normal bilaterally IX,X: gag reflex present XI: bilateral shoulder shrug XII: midline tongue extension without atrophy or fasciculations  Motor: Right :Upper extremity 5/5Left: Upper extremity 5/5 Lower extremity 5/5Lower extremity 5/5 Tone and bulk:normal tone throughout; no atrophy noted Sensory: Pinprick and light touch intact throughout, bilaterally Deep Tendon Reflexes:  Right: Upper Extremity Left: Upper extremity   biceps (C-5 to C-6) 2/4 biceps (C-5 to C-6) 2/4 tricep (C7)  2/4triceps (C7) 2/4 Brachioradialis (C6) 2/4Brachioradialis (C6) 2/4  Lower Extremity Lower Extremity  quadriceps (L-2 to L-4) 0/4 quadriceps (L-2 to L-4) 0/4 Achilles (S1) 0/4Achilles (S1) 0/4  Plantars: Right: downgoingLeft: downgoing Cerebellar: normal finger-to-nose, normal heel-to-shin testing bilaterally Skin: Erythematous rash noted on arms bilaterally  Lab Results: Basic Metabolic Panel:  Recent Labs Lab 06/20/14 0855 06/21/14 0451 06/22/14 0518  NA 135 137 137  K 3.8 3.7 3.4*  CL 101 105 105  CO2 24 24 28   GLUCOSE 99 89 93  BUN 16 9 <5*  CREATININE 0.65 0.57 0.64  CALCIUM 8.3* 8.1* 8.4  MG 1.7  --   --     Liver Function Tests:  Recent Labs Lab 06/20/14 0855 06/21/14 0451 06/22/14 0518  AST 22 18 19   ALT 17 14 14   ALKPHOS 46 40 46  BILITOT 0.6 0.5 0.4  PROT 6.2 5.2* 6.0  ALBUMIN 3.7 3.0* 3.3*   No results for input(s): LIPASE, AMYLASE in the last 168 hours. No results for input(s): AMMONIA in the last 168 hours.  CBC:  Recent Labs Lab 06/20/14 0855 06/22/14 0518  WBC 10.4 4.7  NEUTROABS 9.1*  --   HGB 11.9* 12.3  HCT 36.0 37.0  MCV 100.3* 101.4*  PLT 247 276    Cardiac Enzymes: No results for input(s): CKTOTAL, CKMB, CKMBINDEX, TROPONINI in the last 168 hours.  Lipid Panel: No results for input(s): CHOL, TRIG, HDL, CHOLHDL, VLDL, LDLCALC in the last 168 hours.  CBG: No results for input(s): GLUCAP in the last 168 hours.  Microbiology: No results found for this or any previous visit.  Coagulation Studies: No results for input(s): LABPROT, INR in the last 72 hours.  Imaging: No results found.  Medications:  Scheduled: . enoxaparin (LOVENOX) injection  40 mg Subcutaneous Q24H  . folic acid  1 mg Oral Daily  . multivitamin  with minerals  1 tablet Oral Daily  . pantoprazole  40 mg Oral  Daily  . sodium chloride  3 mL Intravenous Q12H  . thiamine  100 mg Oral Daily   Continuous:   Etta Quill PA-C [facility] [phone]  06/22/2014, 9:40 AM   Patient seen and examined.  Clinical course and management discussed.  Necessary edits performed.  I agree with the above.  Assessment and plan of care developed and discussed below.    Assessment/Plan: 44 YO female with Trazodone and Tegretol overdose.  Latest Tegretol level 5.9.  Myoclonus resolved.  Now only with slight tremor of left hand. EEG showed no epileptiform activity.  Patient not requiring Klonopin.  Recommendations: 1.  D/C Klonopin 2.  No further neurologic intervention is recommended at this time.  If further questions arise, please call or page at that time.  Thank you for allowing neurology to participate in the care of this patient.   Alexis Goodell, MD [facility] [phone] 06/22/2014  1:40 PM

## 2014-06-23 DIAGNOSIS — T50902A Poisoning by unspecified drugs, medicaments and biological substances, intentional self-harm, initial encounter: Secondary | ICD-10-CM

## 2014-06-23 DIAGNOSIS — F310 Bipolar disorder, current episode hypomanic: Secondary | ICD-10-CM

## 2014-06-23 DIAGNOSIS — F1020 Alcohol dependence, uncomplicated: Secondary | ICD-10-CM

## 2014-06-23 MED ORDER — DIPHENHYDRAMINE HCL 25 MG PO CAPS
25.0000 mg | ORAL_CAPSULE | Freq: Four times a day (QID) | ORAL | Status: DC | PRN
  Administered 2014-06-23 – 2014-06-24 (×2): 25 mg via ORAL
  Filled 2014-06-23 (×2): qty 1

## 2014-06-23 NOTE — Progress Notes (Signed)
PATIENT DETAILS Name: Janet Lewis Age: 44 y.o. Sex: female Date of Birth: 1970-07-14 Admit Date: 06/20/2014 Admitting Physician No admitting provider for patient encounter. QMG:[id number],KDTOIZ A, MD  Subjective: No new complaints  Assessment/Plan: Principal Problem:   Tegretol toxicity due to overdose: Admitted and provided with supportive care. Poison control consulted. Tegretol levels now  normalized. Per RN, no further recommendations from poison control. Instability, and mild clonus seems to have resolved. Although not suicidal when she overdosed, very impulsive, ongoing alcohol issues as well. Therefore consulted psychiatry-recommendations are to transfer to inpatient psych. Patient stable for transfer.  Continue with bedside sitter.Awaiting inpatient psych bed  Active Problems:   Alcohol dependence: Claims she binged 2 days prior to admission, claims that this was her first alcohol use in the past 2-3 weeks. Currently no signs of withdrawal. Continue Ativan per [facility] protocol.    Bipolar disorder: Mode currently seems stable. Await further input from psychiatry. Tegretol, Wellbutrin and trazodone remain on hold    GERD:Stable, c/w PPI  Disposition: Remain inpatient-transfer to inpatient psych when bed available  Antibiotics:  See below   Anti-infectives    None      DVT Prophylaxis: Prophylactic Lovenox   Code Status: Full code  Family Communication None at bedside  Procedures:  None  CONSULTS:  neurology and psychiatry   MEDICATIONS: Scheduled Meds: . enoxaparin (LOVENOX) injection  40 mg Subcutaneous Q24H  . folic acid  1 mg Oral Daily  . multivitamin with minerals  1 tablet Oral Daily  . pantoprazole  40 mg Oral Daily  . sodium chloride  3 mL Intravenous Q12H  . thiamine  100 mg Oral Daily   Continuous Infusions:   PRN Meds:.acetaminophen **OR** acetaminophen, clonazePAM, diphenhydrAMINE, ondansetron **OR** ondansetron (ZOFRAN)  IV    PHYSICAL EXAM: Vital signs in last 24 hours: Filed Vitals:   06/22/14 1927 06/22/14 2156 06/23/14 0605 06/23/14 1253  BP: 128/86 110/67 131/78 132/86  Pulse: 85 79 78 77  Temp: 98.3 F (36.8 C) 98.4 F (36.9 C) 98.3 F (36.8 C) 98.5 F (36.9 C)  TempSrc: Oral Oral Oral Oral  Resp: 18 18 18 18   Height:      Weight:      SpO2: 99% 96% 96% 97%    Weight change:  Filed Weights   06/20/14 1415  Weight: 70.534 kg (155 lb 8 oz)   Body mass index is 25.11 kg/(m^2).   Gen Exam: Awake and alert with clear speech.   Neck: Supple, No JVD.   Chest: B/L Clear.  No rales or rhonchi CVS: S1 S2 Regular, no murmurs.  Abdomen: soft, BS +, non tender, non distended.  Extremities: no edema, lower extremities warm to touch. Neurologic: Non Focal.   Skin: No Rash.   Wounds: N/A.    Intake/Output from previous day:  Intake/Output Summary (Last 24 hours) at 06/23/14 1530 Last data filed at 06/23/14 1448  Gross per 24 hour  Intake    483 ml  Output    600 ml  Net   -117 ml     LAB RESULTS: CBC  Recent Labs Lab 06/20/14 0855 06/22/14 0518  WBC 10.4 4.7  HGB 11.9* 12.3  HCT 36.0 37.0  PLT 247 276  MCV 100.3* 101.4*  MCH 33.1 33.7  MCHC 33.1 33.2  RDW 14.0 13.7  LYMPHSABS 0.5*  --   MONOABS 0.7  --   EOSABS 0.0  --   BASOSABS 0.0  --  Chemistries   Recent Labs Lab 06/20/14 0855 06/21/14 0451 06/22/14 0518  NA 135 137 137  K 3.8 3.7 3.4*  CL 101 105 105  CO2 24 24 28   GLUCOSE 99 89 93  BUN 16 9 <5*  CREATININE 0.65 0.57 0.64  CALCIUM 8.3* 8.1* 8.4  MG 1.7  --   --     CBG: No results for input(s): GLUCAP in the last 168 hours.  GFR Estimated Creatinine Clearance: 85.8 mL/min (by C-G formula based on Cr of 0.64).  Coagulation profile No results for input(s): INR, PROTIME in the last 168 hours.  Cardiac Enzymes No results for input(s): CKMB, TROPONINI, MYOGLOBIN in the last 168 hours.  Invalid input(s): CK  Invalid input(s): POCBNP No  results for input(s): DDIMER in the last 72 hours. No results for input(s): HGBA1C in the last 72 hours. No results for input(s): CHOL, HDL, LDLCALC, TRIG, CHOLHDL, LDLDIRECT in the last 72 hours. No results for input(s): TSH, T4TOTAL, T3FREE, THYROIDAB in the last 72 hours.  Invalid input(s): FREET3 No results for input(s): VITAMINB12, FOLATE, FERRITIN, TIBC, IRON, RETICCTPCT in the last 72 hours. No results for input(s): LIPASE, AMYLASE in the last 72 hours.  Urine Studies No results for input(s): UHGB, CRYS in the last 72 hours.  Invalid input(s): UACOL, UAPR, USPG, UPH, UTP, UGL, UKET, UBIL, UNIT, UROB, ULEU, UEPI, UWBC, URBC, UBAC, CAST, UCOM, BILUA  MICROBIOLOGY: No results found for this or any previous visit (from the past 240 hour(s)).  RADIOLOGY STUDIES/RESULTS: Ct Head Wo Contrast  06/20/2014   CLINICAL DATA:  Fall with tremors and jerking. History of alcoholism.  EXAM: CT HEAD WITHOUT CONTRAST  CT CERVICAL SPINE WITHOUT CONTRAST  TECHNIQUE: Multidetector CT imaging of the head and cervical spine was performed following the standard protocol without intravenous contrast. Multiplanar CT image reconstructions of the cervical spine were also generated.  COMPARISON:  None.  FINDINGS: CT HEAD FINDINGS  The brain demonstrates no evidence of hemorrhage, infarction, edema, mass effect, extra-axial fluid collection, hydrocephalus or mass lesion. The skull is unremarkable.  CT CERVICAL SPINE FINDINGS  The cervical spine shows normal alignment. There is no evidence of acute fracture or subluxation. No soft tissue swelling or hematoma is identified. Mild cervical spondylosis at C6-7. No bony or soft tissue lesions are seen. The visualized airway is normally patent.  IMPRESSION: No acute findings by head CT or cervical CT. Mild cervical spondylosis at C6-7.   Electronically Signed   By: Aletta Edouard M.D.   On: 06/20/2014 09:45   Ct Cervical Spine Wo Contrast  06/20/2014   CLINICAL DATA:  Fall  with tremors and jerking. History of alcoholism.  EXAM: CT HEAD WITHOUT CONTRAST  CT CERVICAL SPINE WITHOUT CONTRAST  TECHNIQUE: Multidetector CT imaging of the head and cervical spine was performed following the standard protocol without intravenous contrast. Multiplanar CT image reconstructions of the cervical spine were also generated.  COMPARISON:  None.  FINDINGS: CT HEAD FINDINGS  The brain demonstrates no evidence of hemorrhage, infarction, edema, mass effect, extra-axial fluid collection, hydrocephalus or mass lesion. The skull is unremarkable.  CT CERVICAL SPINE FINDINGS  The cervical spine shows normal alignment. There is no evidence of acute fracture or subluxation. No soft tissue swelling or hematoma is identified. Mild cervical spondylosis at C6-7. No bony or soft tissue lesions are seen. The visualized airway is normally patent.  IMPRESSION: No acute findings by head CT or cervical CT. Mild cervical spondylosis at C6-7.   Electronically Signed  By: Aletta Edouard M.D.   On: 06/20/2014 09:45    Oren Binet, MD  [facility] Pager:[phone]  If 7PM-7AM, please contact night-coverage [email or web address] Password [email or web address] 06/23/2014, 3:30 PM   LOS: 3 days

## 2014-06-23 NOTE — Progress Notes (Signed)
CSW met with this 44 y/o, married, Caucasian, female who was admitted due to overdose of Tegretol.  Patient states that she is feeling better and denies any S/I, H/I, or psychotic symptoms.  Patient presents in hospital garb, affect agitated, mood "better", speech and thought process normal, alert and fully oriented.  Patient insists that, "It was a bad call, I overreacted.  I need to be prescribed something for my bipolar and I think I should be able to do this on an outpatient basis.  I am not doing anything just laying here, but causing myself more debt."  Patient states she got into a verbal altercation with her husband and states her plan is to live with her mother.  Patient's mother is bedside and she states she is accepting of this plan and feels the patient is calm and needs to follow up with medication management and therapy.  Patient states she is possibly separating from her husband  CSW discussed with patient that the last Psych note recommended inpatient and seven facilities had been contacted including [facility] for her care.  Patient states that she will go to [facility] and no other facility and asked what would happened if she refused or left AMA.  "I was never suicidal and I came here voluntarily."  CSW explained that currently there are no beds available at [facility] and this CSW could not predict if there would be beds tomorrow.  CSW contacted the [facility] to request patient be revaluated and  patient agrees to wait until psych comes back to reassess her.  CSW will be available as needed to assist with patient's care.  [facility] Mylinda Brook Richardo Priest ED CSW [phone]

## 2014-06-23 NOTE — Progress Notes (Signed)
CSW contacted Crystal at [facility] who took referral for patient at their psych inpatient unit.  Crystal states the on-call psych will contact the patient's Attending to discuss transfer.  CSW faxed clinicals to [place] will await admission decision.    [facility] Ziad Maye Richardo Priest ED CSW [phone]

## 2014-06-23 NOTE — Discharge Summary (Addendum)
PATIENT DETAILS Name: Janet Lewis Age: 44 y.o. Sex: female Date of Birth: 06-17-1970 MRN: [id number]. Admitting Physician: No admitting provider for patient encounter. UYQ:[id number],ZDGLOV A, MD  Admit Date: 06/20/2014 Discharge date: 06/24/2014  Recommendations for Outpatient Follow-up:  1. Resume psych meds at the discretion of the psychiatry service  PRIMARY DISCHARGE DIAGNOSIS:  Principal Problem:   Tegretol toxicity Active Problems:   Alcohol dependence   Episodic mood disorder   Drug overdose   Bipolar disorder   Drug overdose, intentional   Alcohol abuse      PAST MEDICAL HISTORY: Past Medical History  Diagnosis Date  . Bipolar disorder   . Anxiety     DISCHARGE MEDICATIONS: Current Discharge Medication List    CONTINUE these medications which have NOT CHANGED   Details  pantoprazole (PROTONIX) 20 MG tablet Take 1 tablet (20 mg total) by mouth daily. Qty: 30 tablet, Refills: 1    sucralfate (CARAFATE) 1 G tablet Take 1 tablet (1 g total) by mouth 2 (two) times daily. Qty: 60 tablet, Refills: 0      STOP taking these medications     carbamazepine (TEGRETOL XR) 400 MG 12 hr tablet      diphenhydrAMINE (BENADRYL) 25 mg capsule      Pseudoeph-Doxylamine-DM-APAP (NYQUIL PO)      traZODone (DESYREL) 100 MG tablet      buPROPion (WELLBUTRIN XL) 300 MG 24 hr tablet      cloNIDine (CATAPRES) 0.1 MG tablet      lurasidone (LATUDA) 80 MG TABS tablet      naltrexone (DEPADE) 50 MG tablet      Oxcarbazepine (TRILEPTAL) 600 MG tablet         ALLERGIES:   Allergies  Allergen Reactions  . Codeine Itching    Pt reports allergic reaction of itching to "all pain medications"  . Sulfa Antibiotics Hives  . Neosporin [Neomycin-Bacitracin Zn-Polymyx] Rash    BRIEF HPI:  See H&P, Labs, Consult and Test reports for all details in bis a 44 year old female with history of alcohol dependence, bipolar disorder, who recently completed a detox in this past  December was brought to the ED for overdose of Tegretol and trazodone. Patient unfortunately relapsed, and drank 1 pint of vodka a 2 day's prior to this admission. On the day of admission, she had an argument with her husband and took a handful of Tegretol and trazodone. She was admitted for further evaluation and treatment   CONSULTATIONS:   neurology and psychiatry  PERTINENT RADIOLOGIC STUDIES: Ct Head Wo Contrast  06/20/2014   CLINICAL DATA:  Fall with tremors and jerking. History of alcoholism.  EXAM: CT HEAD WITHOUT CONTRAST  CT CERVICAL SPINE WITHOUT CONTRAST  TECHNIQUE: Multidetector CT imaging of the head and cervical spine was performed following the standard protocol without intravenous contrast. Multiplanar CT image reconstructions of the cervical spine were also generated.  COMPARISON:  None.  FINDINGS: CT HEAD FINDINGS  The brain demonstrates no evidence of hemorrhage, infarction, edema, mass effect, extra-axial fluid collection, hydrocephalus or mass lesion. The skull is unremarkable.  CT CERVICAL SPINE FINDINGS  The cervical spine shows normal alignment. There is no evidence of acute fracture or subluxation. No soft tissue swelling or hematoma is identified. Mild cervical spondylosis at C6-7. No bony or soft tissue lesions are seen. The visualized airway is normally patent.  IMPRESSION: No acute findings by head CT or cervical CT. Mild cervical spondylosis at C6-7.   Electronically Signed   By: Aletta Edouard  M.D.   On: 06/20/2014 09:45   Ct Cervical Spine Wo Contrast  06/20/2014   CLINICAL DATA:  Fall with tremors and jerking. History of alcoholism.  EXAM: CT HEAD WITHOUT CONTRAST  CT CERVICAL SPINE WITHOUT CONTRAST  TECHNIQUE: Multidetector CT imaging of the head and cervical spine was performed following the standard protocol without intravenous contrast. Multiplanar CT image reconstructions of the cervical spine were also generated.  COMPARISON:  None.  FINDINGS: CT HEAD FINDINGS  The  brain demonstrates no evidence of hemorrhage, infarction, edema, mass effect, extra-axial fluid collection, hydrocephalus or mass lesion. The skull is unremarkable.  CT CERVICAL SPINE FINDINGS  The cervical spine shows normal alignment. There is no evidence of acute fracture or subluxation. No soft tissue swelling or hematoma is identified. Mild cervical spondylosis at C6-7. No bony or soft tissue lesions are seen. The visualized airway is normally patent.  IMPRESSION: No acute findings by head CT or cervical CT. Mild cervical spondylosis at C6-7.   Electronically Signed   By: Aletta Edouard M.D.   On: 06/20/2014 09:45     PERTINENT LAB RESULTS: CBC:  Recent Labs  06/22/14 0518  WBC 4.7  HGB 12.3  HCT 37.0  PLT 276   CMET CMP     Component Value Date/Time   NA 137 06/22/2014 0518   K 3.4* 06/22/2014 0518   CL 105 06/22/2014 0518   CO2 28 06/22/2014 0518   GLUCOSE 93 06/22/2014 0518   BUN <5* 06/22/2014 0518   CREATININE 0.64 06/22/2014 0518   CALCIUM 8.4 06/22/2014 0518   PROT 6.0 06/22/2014 0518   ALBUMIN 3.3* 06/22/2014 0518   AST 19 06/22/2014 0518   ALT 14 06/22/2014 0518   ALKPHOS 46 06/22/2014 0518   BILITOT 0.4 06/22/2014 0518   GFRNONAA >90 06/22/2014 0518   GFRAA >90 06/22/2014 0518    GFR Estimated Creatinine Clearance: 85.8 mL/min (by C-G formula based on Cr of 0.64). No results for input(s): LIPASE, AMYLASE in the last 72 hours. No results for input(s): CKTOTAL, CKMB, CKMBINDEX, TROPONINI in the last 72 hours. Invalid input(s): POCBNP No results for input(s): DDIMER in the last 72 hours. No results for input(s): HGBA1C in the last 72 hours. No results for input(s): CHOL, HDL, LDLCALC, TRIG, CHOLHDL, LDLDIRECT in the last 72 hours. No results for input(s): TSH, T4TOTAL, T3FREE, THYROIDAB in the last 72 hours.  Invalid input(s): FREET3 No results for input(s): VITAMINB12, FOLATE, FERRITIN, TIBC, IRON, RETICCTPCT in the last 72 hours. Coags: No results for  input(s): INR in the last 72 hours.  Invalid input(s): PT Microbiology: No results found for this or any previous visit (from the past 240 hour(s)).   BRIEF HOSPITAL COURSE:   Principal Problem:   Tegretol toxicity due to overdose:Admitted and provided with supportive care. Poison control consulted. Tegretol levels now normalized. Per RN, no further recommendations from poison control. Instability, and mild clonus seems to have resolved. Although not suicidal when she overdosed, very impulsive, ongoing alcohol issues as well. Therefore consulted psychiatry-recommendations are to transfer to inpatient psych. Patient stable for transfer. Awaiting inpatient psych bed.  Active Problems:  Alcohol dependence: Claims she binged 2 days prior to admission, claims that this was her first alcohol use in the past 2-3 weeks. Currently no signs of withdrawal. Was on Ativan per [facility] protocol.   Bipolar disorder: Mode currently seems stable. Await further input from psychiatry. Tegretol, Wellbutrin and trazodone remain on hold   GERD:Stable, c/w PPI  TODAY-DAY OF DISCHARGE:  Subjective:   Janet Lewis today has no headache,no chest abdominal pain,no new weakness tingling or numbness, feels much better wants to go home today.   Objective:   Blood pressure 115/82, pulse 76, temperature 99 F (37.2 C), temperature source Oral, resp. rate 19, height 5' 6"  (1.676 m), weight 70.534 kg (155 lb 8 oz), last menstrual period 05/31/2014, SpO2 97 %.  Intake/Output Summary (Last 24 hours) at 06/24/14 1251 Last data filed at 06/24/14 1148  Gross per 24 hour  Intake    960 ml  Output    600 ml  Net    360 ml   Filed Weights   06/20/14 1415  Weight: 70.534 kg (155 lb 8 oz)    Exam Awake Alert, Oriented *3, No new F.N deficits, Normal affect [place].AT,PERRAL Supple Neck,No JVD, No cervical lymphadenopathy appriciated.  Symmetrical Chest wall movement, Good air movement bilaterally, CTAB RRR,No  Gallops,Rubs or new Murmurs, No Parasternal Heave +ve B.Sounds, Abd Soft, Non tender, No organomegaly appriciated, No rebound -guarding or rigidity. No Cyanosis, Clubbing or edema, No new Rash or bruise  DISCHARGE CONDITION: Stable  DISPOSITION: Home  DISCHARGE INSTRUCTIONS:    Activity:  As tolerated   Diet recommendation: Regular Diet  Discharge Instructions    Diet general    Complete by:  As directed      Increase activity slowly    Complete by:  As directed            Follow-up Information    Follow up with [facility]    . Schedule an appointment as soon as possible for a visit in 1 week.   Why:  9am for hospital follow up   Contact information:   [place] [id number] [phone]      Total Time spent on discharge equals 45 minutes.  SignedOren Binet 06/24/2014 12:51 PM

## 2014-06-23 NOTE — Progress Notes (Signed)
CSW pursued inpatient placement.  Pending: [place]: [facility]: [facility]: [facility]:  [facility]: [facility]: [facility]  Other facilities were at capacity.  Hillery Hunter, LCSW Disposition Social Worker [phone]

## 2014-06-24 ENCOUNTER — Inpatient Hospital Stay (HOSPITAL_COMMUNITY)
Admission: AD | Admit: 2014-06-24 | Discharge: 2014-06-27 | DRG: 885 | Disposition: A | Discharge status: Home / Self Care | Payer: BLUE CROSS/BLUE SHIELD | Source: Intra-hospital | Attending: Psychiatry | Admitting: Psychiatry

## 2014-06-24 DIAGNOSIS — Z8249 Family history of ischemic heart disease and other diseases of the circulatory system: Secondary | ICD-10-CM | POA: Diagnosis not present

## 2014-06-24 DIAGNOSIS — F1020 Alcohol dependence, uncomplicated: Secondary | ICD-10-CM | POA: Diagnosis present

## 2014-06-24 DIAGNOSIS — F419 Anxiety disorder, unspecified: Secondary | ICD-10-CM | POA: Diagnosis present

## 2014-06-24 DIAGNOSIS — Z87891 Personal history of nicotine dependence: Secondary | ICD-10-CM

## 2014-06-24 DIAGNOSIS — F10220 Alcohol dependence with intoxication, uncomplicated: Secondary | ICD-10-CM | POA: Insufficient documentation

## 2014-06-24 DIAGNOSIS — G4700 Insomnia, unspecified: Secondary | ICD-10-CM | POA: Diagnosis present

## 2014-06-24 DIAGNOSIS — T1491 Suicide attempt: Secondary | ICD-10-CM | POA: Diagnosis not present

## 2014-06-24 DIAGNOSIS — F314 Bipolar disorder, current episode depressed, severe, without psychotic features: Principal | ICD-10-CM | POA: Diagnosis present

## 2014-06-24 DIAGNOSIS — T421X1A Poisoning by iminostilbenes, accidental (unintentional), initial encounter: Secondary | ICD-10-CM | POA: Diagnosis not present

## 2014-06-24 DIAGNOSIS — T43212A Poisoning by selective serotonin and norepinephrine reuptake inhibitors, intentional self-harm, initial encounter: Secondary | ICD-10-CM | POA: Diagnosis not present

## 2014-06-24 DIAGNOSIS — T421X2A Poisoning by iminostilbenes, intentional self-harm, initial encounter: Secondary | ICD-10-CM | POA: Diagnosis not present

## 2014-06-24 DIAGNOSIS — T50902A Poisoning by unspecified drugs, medicaments and biological substances, intentional self-harm, initial encounter: Secondary | ICD-10-CM | POA: Diagnosis not present

## 2014-06-24 DIAGNOSIS — F1010 Alcohol abuse, uncomplicated: Secondary | ICD-10-CM | POA: Diagnosis not present

## 2014-06-24 MED ORDER — ALUM & MAG HYDROXIDE-SIMETH 200-200-20 MG/5ML PO SUSP: 30.0000 mL | ORAL | Status: DC | PRN

## 2014-06-24 MED ORDER — DIPHENHYDRAMINE HCL 25 MG PO CAPS: 50.0000 mg | ORAL_CAPSULE | Freq: Four times a day (QID) | ORAL | Status: DC | PRN

## 2014-06-24 MED ORDER — MAGNESIUM HYDROXIDE 400 MG/5ML PO SUSP: 30.0000 mL | Freq: Every day | ORAL | Status: DC | PRN

## 2014-06-24 MED ORDER — ACETAMINOPHEN 325 MG PO TABS: 650.0000 mg | ORAL_TABLET | Freq: Four times a day (QID) | ORAL | Status: DC | PRN

## 2014-06-24 MED ORDER — ACETAMINOPHEN 325 MG PO TABS
650.0000 mg | ORAL_TABLET | Freq: Four times a day (QID) | ORAL | Status: DC | PRN
Start: 2014-06-24 — End: 2014-06-27

## 2014-06-24 MED ORDER — CLONAZEPAM 0.5 MG PO TABS: 0.5000 mg | ORAL_TABLET | Freq: Two times a day (BID) | ORAL | Status: DC

## 2014-06-24 MED ORDER — HYDROXYZINE HCL 25 MG PO TABS
25.0000 mg | ORAL_TABLET | Freq: Three times a day (TID) | ORAL | Status: DC | PRN
  Administered 2014-06-24 – 2014-06-25 (×3): 25 mg via ORAL
  Filled 2014-06-24 (×3): qty 1

## 2014-06-24 NOTE — Progress Notes (Signed)
CSW received communication from [facility] stating patient has been accepted to [facility], they will be ready after 1330.  CSW will have patient sign [facility] Consent Form and fax to [facility].  CSW will alert Nursing staff.  [facility] Stehanie Ekstrom Richardo Priest ED CSW [phone]

## 2014-06-24 NOTE — Consult Note (Signed)
Psychiatry Consult Followup  Reason for Consult:  Intentional overdose of psych Medication, bipolar and alcohol abuse Referring Physician:  Dr. Sloan Leiter Patient Identification: Janet Lewis MRN:  [id number] Principal Diagnosis: Tegretol toxicity Diagnosis:   Patient Active Problem List   Diagnosis Date Noted  . Bipolar 1 disorder, depressed, severe [F31.4] 06/24/2014  . Alcohol abuse [F10.10]   . Tegretol toxicity [T42.1X1A] 06/20/2014  . Drug overdose [T50.901A] 06/20/2014  . Bipolar disorder [F31.9] 06/20/2014  . Drug overdose, intentional [T50.902A] 06/20/2014  . Alcohol dependence [F10.20] 06/13/2012  . Episodic mood disorder [F39] 06/13/2012    Total Time spent with patient: 20 minutes  Subjective:   Janet Lewis is a 44 y.o. female patient admitted with CBZ toxicity.  HPI:  Janet Lewis is a 44 year old female seen and chart reviewed for psychiatric consultation and evaluation of intentional overdose of trazodone and carbamazepine followed by an argument/conflict with her husband. Patient reported she has been suffering with the bipolar disorder and alcohol dependence over 10 years and also has been multiple alcohol detox treatments and rehabilitation services in the past. Patient stated she relapsed drinking vodka, and her husband came with breath analyzer to check on her which started conflict between them. Patient also reported she has been noncompliant with her psychiatric medications on and off and not honest with her psychiatric provider. Reportedly she was seen her psychiatric provider 2 days ago and received a one-month supply of the medication and has taken half of medication with intention to hurt herself. Patient reported she does not have any intention to kill herself during his emergency room evaluation. Patient is psychiatrically not stable and medically is also not stable at this current time due to toxic levels of carbamazepine. Patient has a 3 children (85, 80 and 11)  and 2 younger lives at home. 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  Interval history: she has status post intentional drug overdose and CBZ poisoning. Patient has been stable without the symptoms of CBZ toxicity and reports of having hypomanic symptoms as she has no mood stabilizer. Patient states that she is interested to start new mood stabilizer like Lithium which she was not tried before. She is able to sleep and eat okay and talking to family who is visiting her. She has no contact with her husband. She is willing to sign into inpatient psychiatric hospital. Spoke with Wells Guiles, [facility] who may find a bed today and willing to place admit orders on hold. She has no alcohol withdrawal symptoms and denied cravings. She has no evidence of psychosis.  Past Medical History:  Past Medical History  Diagnosis Date  . Bipolar disorder   . Anxiety     Past Surgical History  Procedure Laterality Date  . Augmentation mammaplasty Bilateral 2008  . Refractive surgery Bilateral ~ 2008   Family History:  Family History  Problem Relation Age of Onset  . Heart failure Mother   . Hypertension Mother   . Hypertension Father    Social History:  History  Alcohol Use  . 4.8 oz/week  . 8 Glasses of wine per week     History  Drug Use No    History   Social History  . Marital Status: Married    Spouse Name: N/A  . Number of Children: N/A  . Years of Education: N/A  Social History Main Topics  . Smoking status: Former Smoker -- 1.00 packs/day for 5 years    Types: Cigarettes    Quit date: 12/08/1992  . Smokeless tobacco: Never Used  . Alcohol Use: 4.8 oz/week    8 Glasses of wine per week  . Drug Use: No  . Sexual Activity: Yes     Comment: husband had vasectomy   Other Topics Concern  . None   Social History Narrative    Additional Social History:    Allergies:   Allergies  Allergen Reactions  . Codeine Itching    Pt reports allergic reaction of itching to "all pain medications"  . Sulfa Antibiotics Hives  . Neosporin [Neomycin-Bacitracin Zn-Polymyx] Rash    Vitals: Blood pressure 123/79, pulse 78, temperature 98.6 F (37 C), temperature source Oral, resp. rate 18, height 5' 6"  (1.676 m), weight 70.534 kg (155 lb 8 oz), last menstrual period 05/31/2014, SpO2 99 %.  Risk to Self: Is patient at risk for suicide?: No Risk to Others:   Prior Inpatient Therapy:   Prior Outpatient Therapy:    Current Facility-Administered Medications  Medication Dose Route Frequency Provider Last Rate Last Dose  . acetaminophen (TYLENOL) tablet 650 mg  650 mg Oral Q6H PRN Nishant Dhungel, MD       Or  . acetaminophen (TYLENOL) suppository 650 mg  650 mg Rectal Q6H PRN Nishant Dhungel, MD      . alum & mag hydroxide-simeth (MAALOX/MYLANTA) 200-200-20 MG/5ML suspension 30 mL  30 mL Oral Q4H PRN Durward Parcel, MD      . clonazePAM (KLONOPIN) tablet 0.5 mg  0.5 mg Oral BID Durward Parcel, MD      . diphenhydrAMINE (BENADRYL) capsule 50 mg  50 mg Oral Q6H PRN Durward Parcel, MD      . enoxaparin (LOVENOX) injection 40 mg  40 mg Subcutaneous Q24H Nishant Dhungel, MD   40 mg at 06/23/14 2108  . folic acid (FOLVITE) tablet 1 mg  1 mg Oral Daily Nishant Dhungel, MD   1 mg at 06/24/14 1054  . magnesium hydroxide (MILK OF MAGNESIA) suspension 30 mL  30 mL Oral Daily PRN Durward Parcel, MD      . multivitamin with minerals tablet 1 tablet  1 tablet Oral Daily Nishant Dhungel, MD   1 tablet at 06/24/14 1054  . ondansetron (ZOFRAN) tablet 4 mg  4 mg Oral Q6H PRN Nishant Dhungel, MD       Or  . ondansetron (ZOFRAN) injection 4 mg  4 mg Intravenous Q6H PRN Nishant Dhungel, MD      . pantoprazole (PROTONIX) EC tablet 40 mg  40 mg Oral Daily Nishant Dhungel, MD   40 mg at 06/24/14 1054  .  sodium chloride 0.9 % injection 3 mL  3 mL Intravenous Q12H Nishant Dhungel, MD   3 mL at 06/24/14 1054  . thiamine (VITAMIN B-1) tablet 100 mg  100 mg Oral Daily Nishant Dhungel, MD   100 mg at 06/24/14 1054    Musculoskeletal: Strength & Muscle Tone: decreased Gait & Station: unable to stand Patient leans: N/A  Psychiatric Specialty Exam: Physical Exam as per history and physical   ROS depression, anxiety, self harming behavior and alcohol intoxication   Blood pressure 123/79, pulse 78, temperature 98.6 F (37 C), temperature source Oral, resp. rate 18, height 5' 6"  (1.676 m), weight 70.534 kg (155 lb 8 oz), last menstrual period 05/31/2014, SpO2 99 %.Body mass index is 25.11 kg/(m^2).  General Appearance: Guarded  Eye Contact::  Fair  Speech:  Clear and Coherent and Slow  Volume:  Decreased  Mood:  Depressed, Dysphoric and Hopeless  Affect:  Labile  Thought Process:  Coherent and Goal Directed  Orientation:  Full (Time, Place, and Person)  Thought Content:  WDL  Suicidal Thoughts:  No status post intentional drug overdose   Homicidal Thoughts:  No  Memory:  Immediate;   Fair Recent;   Fair  Judgement:  Impaired  Insight:  Shallow  Psychomotor Activity:  Psychomotor Retardation  Concentration:  Fair  Recall:  Good  Fund of Knowledge:Good  Language: Good  Akathisia:  NA  Handed:  Right  AIMS (if indicated):     Assets:  Communication Skills Desire for Improvement Financial Resources/Insurance Housing Intimacy Leisure Time Physical Health Resilience Social Support Talents/Skills Transportation  ADL's:  Intact  Cognition: WNL  Sleep:      Medical Decision Making: Review of Psycho-Social Stressors (1), Review or order clinical lab tests (1), Decision to obtain old records (1), Established Problem, Worsening (2), Review or order medicine tests (1), Review of Medication Regimen & Side Effects (2) and Review of New Medication or Change in Dosage (2)  Treatment Plan  Summary: Daily contact with patient to assess and evaluate symptoms and progress in treatment and Medication management  Plan:  Patient may benefit from Lithium due to impulsive agitation and mood swings Carbamazepine level is 5.9 as this morning, so longer toxic at this time. Recommend psychiatric Inpatient admission when medically cleared. Supportive therapy provided about ongoing stressors.  Appreciate psychiatric consultation  Please contact [phone] or [phone] if needs further assistance Psychiatric social service follow-up with patient regarding appropriate inpatient psychiatric placement Patient may need involuntary commitment if she refuses inpatient treatment  Disposition: Inpatient psychiatric hospitalization for crisis stabilization, safety monitoring on medication management.  Montzerrat Brunell,JANARDHAHA R. 06/24/2014 2:51 PM

## 2014-06-24 NOTE — Progress Notes (Signed)
D: Patient in the dayroom on approach.  Patient states she she has just arrived to the unit today.  Patient voiced her concerns of not getting her medications.  Patient states she is scared she will become manic.  Patient states she also is concerned because she has been a former patient of Dr. Sabra Heck and wants to see him. Patient denies SI/HI and denies AVH.   A: Staff to monitor Q 15 mins for safety.  Encouragement and support offered.  No scheduled medications administered per orders.  Vistaril administered prn. R: Patient remains safe on the unit.  Patient attended group tonight.  Patient taking administered medications.  Patient visible on the unit and interacting with peers.

## 2014-06-24 NOTE — Tx Team (Signed)
Initial Interdisciplinary Treatment Plan   PATIENT STRESSORS: Marital or family conflict Substance abuse   PATIENT STRENGTHS: Ability for insight Average or above average intelligence General fund of knowledge Motivation for treatment/growth Supportive family/friends   PROBLEM LIST: Problem List/Patient Goals Date to be addressed Date deferred Reason deferred Estimated date of resolution  "stable med regimen" 06/24/2014  06/24/2014    "I would drink even more if I wasn't monitored so closely at home" - alcohol abuse 06/24/2014 06/24/2014    "my depression has been worse" 06/24/2014 06/24/2014    "I'm scared of becoming manic" 06/24/2014 06/24/2014                                   DISCHARGE CRITERIA:  Ability to meet basic life and health needs Improved stabilization in mood, thinking, and/or behavior Reduction of life-threatening or endangering symptoms to within safe limits Verbal commitment to aftercare and medication compliance Withdrawal symptoms are absent or subacute and managed without 24-hour nursing intervention  PRELIMINARY DISCHARGE PLAN: Attend 12-step recovery group Outpatient therapy Return to previous living arrangement  PATIENT/FAMIILY INVOLVEMENT: This treatment plan has been presented to and reviewed with the patient, Janet Lewis. The patient and family have been given the opportunity to ask questions and make suggestions.  [name] 06/24/2014, 5:48 PM

## 2014-06-24 NOTE — Progress Notes (Signed)
Report given to Gerald Stabs, [place].  Awaiting transport.

## 2014-06-24 NOTE — Progress Notes (Signed)
Patient ID: Janet Lewis, female   DOB: 10/02/1970, 44 y.o.   MRN: [id number]  44 year old female presents to [facility] with complaints of depression and substance abuse. Pt has a flat affect and pleasant behavior. Pt currently denies SI/HI and S/VH. Pt reports that she "took what they told me was about a months worth of Tegretol and Trazadone. I'm monitored so much as my house, my husband gets mean when he thinks I'm drinking. He yelled at me and so I took the pills. When I woke up the next day, I couldn't walk straight and kept falling. I hit my head on the tub, fell down the stairs and called 911." Pt reports a long history of alcohol use and previous detox treatments. Pt reports that "I want to be on a stable med regimen. I'm scared I'm going to go into a manic phase if I don't get help now." Pt reports a history of seizures with withdrawal "once during detox a couple of years ago."   Pt oriented to unit, consents signed and pt belongings secured. Will continue to monitor. Pt is currently at dinner in the cafeteria.

## 2014-06-25 ENCOUNTER — Encounter (HOSPITAL_COMMUNITY): Payer: Self-pay

## 2014-06-25 DIAGNOSIS — F1010 Alcohol abuse, uncomplicated: Secondary | ICD-10-CM

## 2014-06-25 DIAGNOSIS — F314 Bipolar disorder, current episode depressed, severe, without psychotic features: Principal | ICD-10-CM

## 2014-06-25 LAB — TSH: TSH: 3.71 u[IU]/mL (ref 0.350–4.500)

## 2014-06-25 MED ORDER — LITHIUM CARBONATE 300 MG PO CAPS
300.0000 mg | ORAL_CAPSULE | Freq: Two times a day (BID) | ORAL | Status: DC
  Administered 2014-06-25 – 2014-06-26 (×2): 300 mg via ORAL
  Filled 2014-06-25 (×6): qty 1

## 2014-06-25 MED ORDER — HYDROXYZINE HCL 25 MG PO TABS
25.0000 mg | ORAL_TABLET | ORAL | Status: DC | PRN
  Administered 2014-06-25 – 2014-06-27 (×2): 25 mg via ORAL
  Filled 2014-06-25: qty 6
  Filled 2014-06-25 (×2): qty 1

## 2014-06-25 MED ORDER — TRAZODONE HCL 150 MG PO TABS
150.0000 mg | ORAL_TABLET | Freq: Every day | ORAL | Status: DC
  Administered 2014-06-25 – 2014-06-26 (×2): 150 mg via ORAL
  Filled 2014-06-25: qty 1
  Filled 2014-06-25: qty 3
  Filled 2014-06-25 (×3): qty 1

## 2014-06-25 NOTE — Progress Notes (Signed)
Patient ID: Janet Lewis, female   DOB: February 01, 1971, 44 y.o.   MRN: [id number]   Pt currently presents with a flat affect and depressed/anxious behavior. Per self inventory, pt rates depression at a 4, hopelessness 5 and anxiety8. Pt's daily goal is to "mood stabilization, decrease depression" and they intend to do so by "attend all groups, be cooperative." Pt reports poor sleep, good concentration and appetite.   Pt provided with medications per providers orders. Pt's labs and vitals were monitored throughout the day. Pt supported emotionally and encouraged to express concerns and questions. Pt educated on substance abuse.  Pt's safety ensured with 15 minute and environmental checks. Pt currently denies SI/HI and A/V hallucinations. Pt verbally agrees to seek staff if SI/HI or A/VH occurs and to consult with staff before acting on these thoughts. Pt requests to be started on something for anxiety and sleep. Pt had a hard time sleeping last night.
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
## 2014-06-25 NOTE — BHH Suicide Risk Assessment (Signed)
[facility] Admission Suicide Risk Assessment   Nursing information obtained from:    Demographic factors:    Current Mental Status:    Loss Factors:    Historical Factors:    Risk Reduction Factors:    Total Time spent with patient: 30 minutes Principal Problem: Bipolar 1 disorder, depressed, severe Diagnosis:   Patient Active Problem List   Diagnosis Date Noted  . Alcohol dependence [F10.20] 06/13/2012    Priority: High  . Bipolar 1 disorder, depressed, severe [F31.4] 06/24/2014  . Alcohol abuse [F10.10]   . Tegretol toxicity [T42.1X1A] 06/20/2014  . Drug overdose [T50.901A] 06/20/2014  . Bipolar disorder [F31.9] 06/20/2014  . Drug overdose, intentional [T50.902A] 06/20/2014  . Episodic mood disorder [F39] 06/13/2012     Continued Clinical Symptoms:  Alcohol Use Disorder Identification Test Final Score (AUDIT): 30 The "Alcohol Use Disorders Identification Test", Guidelines for Use in Primary Care, Second Edition.  World Pharmacologist [facility]). Score between 0-7:  no or low risk or alcohol related problems. Score between 8-15:  moderate risk of alcohol related problems. Score between 16-19:  high risk of alcohol related problems. Score 20 or above:  warrants further diagnostic evaluation for alcohol dependence and treatment.   CLINICAL FACTORS:   Bipolar Disorder:   Depressive phase Alcohol/Substance Abuse/Dependencies   Musculoskeletal: Strength & Muscle Tone: within normal limits Gait & Station: normal Patient leans: N/A  Psychiatric Specialty Exam: Physical Exam  Review of Systems  Constitutional: Positive for malaise/fatigue.  HENT: Negative.   Eyes: Negative.   Respiratory: Negative.   Cardiovascular: Negative.   Gastrointestinal: Negative.   Genitourinary: Negative.   Musculoskeletal: Negative.   Skin: Positive for rash.  Neurological: Positive for weakness.  Endo/Heme/Allergies: Negative.   Psychiatric/Behavioral: Positive for depression and substance  abuse. The patient is nervous/anxious and has insomnia.     Blood pressure 109/71, pulse 101, temperature 97.8 F (36.6 C), temperature source Oral, resp. rate 18, height 5' 6"  (1.676 m), weight 70.308 kg (155 lb), last menstrual period 05/31/2014, SpO2 97 %.Body mass index is 25.03 kg/(m^2).  General Appearance: Fairly Groomed  Engineer, water::  Fair  Speech:  Clear and Coherent  Volume:  Normal  Mood:  Anxious, Depressed and Dysphoric  Affect:  Depressed and Tearful  Thought Process:  Coherent and Goal Directed  Orientation:  Full (Time, Place, and Person)  Thought Content:  events symptoms worries concerns  Suicidal Thoughts:  No  Homicidal Thoughts:  No  Memory:  Immediate;   Fair Recent;   Fair Remote;   Fair  Judgement:  Fair  Insight:  Present  Psychomotor Activity:  Decreased  Concentration:  Fair  Recall:  [employer] of Knowledge:Fair  Language: Fair  Akathisia:  No  Handed:  Right  AIMS (if indicated):     Assets:  Desire for Improvement Vocational/Educational  Sleep:     Cognition: WNL  ADL's:  Intact     COGNITIVE FEATURES THAT CONTRIBUTE TO RISK:  Closed-mindedness, Polarized thinking and Thought constriction (tunnel vision)    SUICIDE RISK:   Mild:  Suicidal ideation of limited frequency, intensity, duration, and specificity.  There are no identifiable plans, no associated intent, mild dysphoria and related symptoms, good self-control (both objective and subjective assessment), few other risk factors, and identifiable protective factors, including available and accessible social support.  PLAN OF CARE: Supportive approach/coping skills/relapse prevention Alcohol Dependence: Work a relapse prevention plan/reassess for anti craving medications Bipolar Disorder, depressed: she OD on the Tegretol. She felt that the  tegretol kept her from getting "manic" but she was still experiencing a lot of depression lack of energy motivation etc. She lost her job of 15 years  mostly because of consequences from her drinking but she found another job that she likes. States she does not want to loose this other job on account of her drinking and her depression. She agrees it would probably not be a good idea to go back on the Tegretol but will like a medication that would help the mania and at the same time help with the depression. We had discussed using Lithium or Depakote in the past. She right now has a rash that could have been secondary to Tegretol. Will abstain from using another anticonvulsant and try Lithium CO3.  She OD impulsively  after she got in an argument with her husband after she had relapsed on alcohol. Her drinking is causing a lot of conflict with her husband.  Will check her thyroid  Medical Decision Making:  Review of Psycho-Social Stressors (1), Review or order clinical lab tests (1), Review of Medication Regimen & Side Effects (2) and Review of New Medication or Change in Dosage (2)  I certify that inpatient services furnished can reasonably be expected to improve the patient's condition.   [place] A 06/25/2014, 6:07 PM

## 2014-06-25 NOTE — BHH Group Notes (Signed)
[facility] LCSW Aftercare Discharge Planning Group Note   06/25/2014 11:05 AM  Participation Quality:  Engaged   Mood/Affect:  Depressed  Depression Rating:  denies  Anxiety Rating:  denies  Thoughts of Suicide:  No Will you contract for safety?   NA  Current AVH:  No  Plan for Discharge/Comments:  Pt states she wanted to be admitted to [facility] only from [facility] as she wants to work with Dr Sabra Heck.  Identifies self as "Bipolar depression and Alcoholism."  Has been diagnosed for 10 years.  Prescribed meds by Debbora Dus at Dr Arvil Persons office, and says she has been to rehab twice, [facility] and [facility] in [place].  Has had 60 days of sobriety by going to [facility] and having a sponsor.  Blames sponsor's actions for relapse.  Minimizes alcohol problem, and rejects referral to rehab or IOP.  "I still owe money from the last time.  I know what I need to do.  I need to get back to work and stay busy."  Transportation Means: family  Supports: family  [place], Ernestine Mcmurray

## 2014-06-25 NOTE — BHH Group Notes (Signed)
[facility] LCSW Group Therapy  06/25/2014 1:15 pm  Type of Therapy: Process Group Therapy  Participation Level: Did not attend.  Interview with NP.  Summary of Progress/Problems: Today's group addressed the issue of overcoming obstacles.  Patients were asked to identify their biggest obstacle post d/c that stands in the way of their on-going success, and then problem solve as to how to manage this.  Trish Mage 06/25/2014   3:56 PM

## 2014-06-26 ENCOUNTER — Encounter (HOSPITAL_COMMUNITY): Payer: Self-pay | Admitting: Psychiatry

## 2014-06-26 DIAGNOSIS — F1020 Alcohol dependence, uncomplicated: Secondary | ICD-10-CM

## 2014-06-26 MED ORDER — LAMOTRIGINE 25 MG PO TABS
25.0000 mg | ORAL_TABLET | Freq: Every day | ORAL | Status: DC
  Administered 2014-06-26 – 2014-06-27 (×2): 25 mg via ORAL
  Filled 2014-06-26 (×4): qty 1
  Filled 2014-06-26: qty 3

## 2014-06-26 MED ORDER — LITHIUM CARBONATE 300 MG PO CAPS
300.0000 mg | ORAL_CAPSULE | Freq: Three times a day (TID) | ORAL | Status: DC
  Administered 2014-06-26 – 2014-06-27 (×4): 300 mg via ORAL
  Filled 2014-06-26: qty 9
  Filled 2014-06-26 (×3): qty 1
  Filled 2014-06-26: qty 9
  Filled 2014-06-26: qty 1
  Filled 2014-06-26: qty 9
  Filled 2014-06-26: qty 1
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
## 2014-06-26 NOTE — Progress Notes (Signed)
D: Pt presents anxious in affect and pleasant in mood. Pt reports an improvement in her day upon speaking to her psychiatrist about her POC which included her medication adjustments. Pt actively participated within the milieu. Pt is currently negative for any SI/HI/AVH. A: Writer administered scheduled and prn medications to pt, per MD orders. Continued support and availability as needed was extended to this pt. Staff continue to monitor pt with q58mn checks.  R: No adverse drug reactions noted. Pt receptive to treatment. Pt remains safe at this time.
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
## 2014-06-26 NOTE — Progress Notes (Signed)
[facility] MD Progress Note  06/26/2014 3:43 PM Janet Lewis  MRN:  [id number] Subjective:  Janet Lewis has tolerated the first Lithium doses well. The rash is stable. They have said is secondary to psoriasis but it  has not responded to multiple medication trials. She is willing to give Lamictal a try as states she really wants her mood to be stable looking at being able to control her drinking. States she gave AA a good try before but that it does not work for her. She wants to be able to pursue individual counseling what she thinks works better. She is worried about her son who she says is prone to anger spells. He does well in school and is into sports but has that loss of control she is afraid might get to be an issue down the road. Did not sleep well last night. Thinks that maybe she took a Vistaril with the Trazodone and she has not done that before. Not sure if it was activating Principal Problem: Alcohol dependence Diagnosis:   Patient Active Problem List   Diagnosis Date Noted  . Alcohol dependence [F10.20] 06/13/2012    Priority: High  . Bipolar 1 disorder, depressed, severe [F31.4] 06/24/2014  . Alcohol abuse [F10.10]   . Tegretol toxicity [T42.1X1A] 06/20/2014  . Drug overdose [T50.901A] 06/20/2014  . Bipolar disorder [F31.9] 06/20/2014  . Drug overdose, intentional [T50.902A] 06/20/2014  . Episodic mood disorder [F39] 06/13/2012   Total Time spent with patient: 30 minutes   Past Medical History:  Past Medical History  Diagnosis Date  . Bipolar disorder   . Anxiety     Past Surgical History  Procedure Laterality Date  . Augmentation mammaplasty Bilateral 2008  . Refractive surgery Bilateral ~ 2008   Family History:  Family History  Problem Relation Age of Onset  . Heart failure Mother   . Hypertension Mother   . Hypertension Father    Social History:  History  Alcohol Use  . 4.8 oz/week  . 8 Glasses of wine per week     History  Drug Use No    History   Social  History  . Marital Status: Married    Spouse Name: N/A  . Number of Children: N/A  . Years of Education: N/A   Social History Main Topics  . Smoking status: Former Smoker -- 1.00 packs/day for 5 years    Types: Cigarettes    Quit date: 12/08/1992  . Smokeless tobacco: Never Used  . Alcohol Use: 4.8 oz/week    8 Glasses of wine per week  . Drug Use: No  . Sexual Activity: Yes     Comment: husband had vasectomy   Other Topics Concern  . None   Social History Narrative   Additional History:    Sleep: Poor last night  Appetite:  Fair   Assessment:   Musculoskeletal: Strength & Muscle Tone: within normal limits Gait & Station: normal Patient leans: N/A   Psychiatric Specialty Exam: Physical Exam  Review of Systems  Constitutional: Negative.   HENT: Negative.   Eyes: Negative.   Respiratory: Negative.   Cardiovascular: Negative.   Gastrointestinal: Negative.   Genitourinary: Negative.   Musculoskeletal: Negative.   Skin: Negative.   Neurological: Negative.   Endo/Heme/Allergies: Negative.   Psychiatric/Behavioral: Positive for depression and substance abuse. The patient is nervous/anxious.     Blood pressure 108/73, pulse 86, temperature 98.1 F (36.7 C), temperature source Oral, resp. rate 16, height 5' 6"  (1.676 m), weight 70.308 kg (  155 lb), last menstrual period 05/31/2014, SpO2 97 %.Body mass index is 25.03 kg/(m^2).  General Appearance: Fairly Groomed  Engineer, water::  Fair  Speech:  Clear and Coherent  Volume:  Normal  Mood:  Anxious and worried, sad regretful   Affect:  anxious worried  Thought Process:  Coherent and Goal Directed  Orientation:  Full (Time, Place, and Person)  Thought Content:  deals with the most recent events her wanting to get her life back together   Suicidal Thoughts:  No  Homicidal Thoughts:  No  Memory:  Immediate;   Fair Recent;   Fair Remote;   Fair  Judgement:  Fair  Insight:  Present  Psychomotor Activity:  Normal   Concentration:  Fair  Recall:  [employer] of Knowledge:Fair  Language: Fair  Akathisia:  No  Handed:  Right  AIMS (if indicated):     Assets:  Desire for Improvement Housing Social Support Vocational/Educational  ADL's:  Intact  Cognition: WNL  Sleep:  Number of Hours: 6.25     Current Medications: Current Facility-Administered Medications  Medication Dose Route Frequency Provider Last Rate Last Dose  . acetaminophen (TYLENOL) tablet 650 mg  650 mg Oral Q6H PRN Durward Parcel, MD      . alum & mag hydroxide-simeth (MAALOX/MYLANTA) 200-200-20 MG/5ML suspension 30 mL  30 mL Oral Q4H PRN Durward Parcel, MD      . hydrOXYzine (ATARAX/VISTARIL) tablet 25 mg  25 mg Oral Q4H PRN Nicholaus Bloom, MD   25 mg at 06/25/14 2215  . lamoTRIgine (LAMICTAL) tablet 25 mg  25 mg Oral Daily Nicholaus Bloom, MD   25 mg at 06/26/14 1207  . lithium carbonate capsule 300 mg  300 mg Oral TID WC Nicholaus Bloom, MD   300 mg at 06/26/14 1120  . magnesium hydroxide (MILK OF MAGNESIA) suspension 30 mL  30 mL Oral Daily PRN Durward Parcel, MD      . traZODone (DESYREL) tablet 150 mg  150 mg Oral QHS Nicholaus Bloom, MD   150 mg at 06/25/14 2216    Lab Results:  Results for orders placed or performed during the hospital encounter of 06/24/14 (from the past 48 hour(s))  TSH     Status: None   Collection Time: 06/25/14  7:57 PM  Result Value Ref Range   TSH 3.710 0.350 - 4.500 uIU/mL    Comment: Performed at [facility]    Physical Findings: AIMS:  , ,  ,  ,    CIWA:  CIWA-Ar Total: 0 COWS:  COWS Total Score: 0  Treatment Plan Summary: Daily contact with patient to assess and evaluate symptoms and progress in treatment and Medication management Supportive approach/coping skills/relapse prevention Alcohol Dependence: work a relapse prevention plan Bipolar Disorder: increase the Lithium Carbonate to 300 mg TID                             Get a lithium level                              Start Lamictal 25 mg watching for worsening of the skin rash   Medical Decision Making:  Review of Psycho-Social Stressors (1), Review or order clinical lab tests (1), Review of Medication Regimen & Side Effects (2) and Review of New Medication or Change in Dosage (2)  Dalena Plantz A 06/26/2014, 3:43 PM

## 2014-06-26 NOTE — Progress Notes (Signed)
Recreation Therapy Notes  Animal-Assisted Activity/Therapy (AAA/T) Program Checklist/Progress Notes Patient Eligibility Criteria Checklist & Daily Group note for Rec Tx Intervention  Date: 03.01.2016 Time: 2:45pm Location: [facility] [employer]   AAA/T Program Assumption of Risk Form signed by Patient/ or Parent Legal Guardian yes  Patient is free of allergies or sever asthma yes  Patient reports no fear of animals yes  Patient reports no history of cruelty to animals yes  Patient understands his/her participation is voluntary yes  Patient washes hands before animal contact yes  Patient washes hands after animal contact yes  Behavioral Response: Appropriate   Education: Hand Washing, Appropriate Animal Interaction   Education Outcome: Acknowledges education.   Clinical Observations/Feedback: Patient interacted appropriately with therapy dog, petting him appropriately and sharing stories about her pets at home with group.   Laureen Ochs Raelyn Racette, LRT/CTRS  Lane Hacker 06/26/2014 7:37 PM

## 2014-06-26 NOTE — Progress Notes (Signed)
Pt alert and cooperative. Affect/ mood sad and anxious but brightens on approach.  "It helps to hear other people, I have picked up different positive strategies". -SI/HI, -A/vhall, verbally contracts for safety. Denies pain or physical discomfort.  Visible in dayroom, interactive with peers. Emotional support and encouragement given. Will continue to monitor closely and evaluate for stabilization.

## 2014-06-26 NOTE — Tx Team (Signed)
Interdisciplinary Treatment Plan Update (Adult)   Date: 06/26/2014  Time Reviewed:9:30AM Progress in Treatment:  Attending groups: Yes  Participating in groups: Yes    Taking medication as prescribed: Yes  Tolerating medication: Yes  Family/Significant othe contact made: Not yet. SPE required for this pt.  Patient understands diagnosis: Yes, AEB seeking treatment for substance abuse/alcohol use, recent overdose, depression/bipolar Sx that have become unmanaged, and for medication stabilization.  Discussing patient identified problems/goals with staff: Yes  Medical problems stabilized or resolved: Yes  Denies suicidal/homicidal ideation: Yes during self report.  Patient has not harmed self or Others: Yes  New problem(s) identified:  Discharge Plan or Barriers: CSW assessing for appropriate referrals. PSA needed.  Additional comments: Janet Lewis is a 44 year old married Caucasian female who was brought to the [facility] after overdosing on Tegretol and Trazodone. Patient drinks one pint of four times daily and reports trying to quit for past one week until yesterday when she again started drinking since yesterday morning. She had confrontation with her husband l and took a handful of trazodone and Tegretol. She reports taking the medications in anger and did not intend to kill herself. Denies suicidal ideation at this time. She got up the next morning when she got up to go to work, was very unsteady and fell 3 times at home. Patient states the following during her psychiatric assessment "I am an alcoholic. I have never really stopped drinking. This all happened last Tuesday. I have strained relationships with family. My husband realized I had been drinking and got very upset. He said some very nasty things to me. I just took the medicine because he made me feel so bad. I lost my job in December because I was having trouble functioning. I have never been on medicines that fully controlled my Bipolar. I was  taking Tegretol but was still having trouble functioning. I use alcohol to self medicate. I get manic sometimes and will make rash purchases. I do not hear voices or have suicidal thoughts. My son called 71 when he saw the shape I was in. Even the neighbors found out what happened and that makes me feel ashamed. I want to stop drinking and be good to my family. My husband told me that he will go to therapy if I stop drinking. I am not having any withdrawal symptoms because I was not able to drink everyday. My family monitors me fairly closely. I also do not use any other substances." The patient was cooperative with her admission assessment. Reported racing thoughts and was noted to have some pressured speech. She is anxious to be restarted on medication but understands that her Tegretol level was recently toxic due to overdose. Patient reports having more frequent depressive episodes versus manic. She describes having trouble functioning in regards to going to work and caring for her children. Patient is requesting to have her medications adjusted for improved mood control.  Reason for Continuation of Hospitalization: Mood instability/depression Medication management  Estimated length of stay: 2-4 days  For review of initial/current patient goals, please see plan of care.  Attendees:  Patient:    Family:    Physician: Dr. Shea Evans MD 06/26/2014   Nursing: Dulcy Fanny T RN 06/26/2014   Clinical Social Worker [place], [place]  06/26/2014   Other: Adela Glimpse. LCSW 06/26/2014   Other: Gerline Legacy Nurse CM 06/26/2014   Other: Hilda Lias, Community Care Coordinator  06/26/2014   Other: Mateo Flow; [facility] TCT  06/26/2014   Scribe for Treatment Team:  [place] LCSWA 06/26/2014 9:30AM

## 2014-06-26 NOTE — [facility] Group Notes (Signed)
Adult Psychoeducational Group Note  Date:  06/26/2014 Time:  12:03 AM  Group Topic/Focus:  Wrap-Up Group:   The focus of this group is to help patients review their daily goal of treatment and discuss progress on daily workbooks.  Participation Level:  Minimal  Participation Quality:  Appropriate  Affect:  Appropriate  Cognitive:  Appropriate  Insight: Good  Engagement in Group:  Limited  Modes of Intervention:  Discussion  Additional Comments:  Ocia stated her day was good.  She went to groups, took a nap and saw the doctor.  Victorino Sparrow A 06/26/2014, 12:03 AM

## 2014-06-26 NOTE — [facility] Group Notes (Signed)
[facility] Group Notes:  (Nursing/MHT/Case Management/Adjunct)  Date:  06/26/2014  Time:  0900 am  Type of Therapy:  Psychoeducational Skills  Participation Level:  Active  Participation Quality:  Appropriate and Attentive  Affect:  Appropriate  Cognitive:  Alert, Appropriate and Oriented  Insight:  Appropriate  Engagement in Group:  Engaged and Supportive  Modes of Intervention:  Support  Summary of Progress/Problems: Patient talked about the monitoring her family makes her go through a home.  Her husband regularly checks her with a breathalyzer.  She has had to have a lock on her car. Patient states she went to pick up her son at school drunk.  She has decided to have the lock hold placed back on her car.  She also is looking forward to going to counseling with her husband.    Zipporah Plants 06/26/2014, 9:48 AM

## 2014-06-27 DIAGNOSIS — F10220 Alcohol dependence with intoxication, uncomplicated: Secondary | ICD-10-CM | POA: Insufficient documentation

## 2014-06-27 LAB — LITHIUM LEVEL: Lithium Lvl: 0.28 mmol/L — ABNORMAL LOW (ref 0.80–1.40)

## 2014-06-27 MED ORDER — TRAZODONE HCL 150 MG PO TABS: 150.0000 mg | ORAL_TABLET | Freq: Every day | ORAL | Status: DC

## 2014-06-27 MED ORDER — HYDROXYZINE HCL 25 MG PO TABS: 25.0000 mg | ORAL_TABLET | ORAL | Status: DC | PRN

## 2014-06-27 MED ORDER — LITHIUM CARBONATE 300 MG PO CAPS: 300.0000 mg | ORAL_CAPSULE | Freq: Three times a day (TID) | ORAL | Status: AC

## 2014-06-27 MED ORDER — LAMOTRIGINE 25 MG PO TABS: 25.0000 mg | ORAL_TABLET | Freq: Every day | ORAL | Status: DC

## 2014-06-27 NOTE — B[facility] Counselor (Signed)
Adult Comprehensive Assessment  Patient ID: Janet Lewis, female   DOB: [birth year], 44 y.o.   MRN: [id number]  Pt d/ced within 72 hours. PSA not completed/required.     [place], [place] LCSWA 06/27/2014

## 2014-06-27 NOTE — Clinical Social Work Note (Signed)
Pt d/ced within 72 hours-PSA not completed/no required.  [place], LCSWA 06/27/2014 11:18 AM

## 2014-06-27 NOTE — Progress Notes (Signed)
Patient ID: Janet Lewis, female   DOB: April 14, 1971, 44 y.o.   MRN: [id number]  D: Patient reports feeling well today and feels ready for discharge. No withdrawals or SI at this time. Treatment team felt patient could be discharged. A: Obtained all belongings, medications, prescriptions, and follow-up appointments. R: Family here to pick patient up.

## 2014-06-27 NOTE — Discharge Summary (Signed)
Physician Discharge Summary Note  Patient:  Janet Lewis is an 44 y.o., female MRN:  [id number] DOB:  17-Feb-1971 Patient phone:  [phone] (home)  Patient address:   [address] [place] [address],  Total Time spent with patient: Greater than 30 minutres  Date of Admission:  06/24/2014 Date of Discharge: 06/28/14  Reason for Admission: Alcohol detox/mood stabilization  Principal Problem: Alcohol dependence Discharge Diagnoses: Patient Active Problem List   Diagnosis Date Noted  . Alcohol dependence with uncomplicated intoxication [F10.220]   . Bipolar 1 disorder, depressed, severe [F31.4] 06/24/2014  . Alcohol abuse [F10.10]   . Tegretol toxicity [T42.1X1A] 06/20/2014  . Drug overdose [T50.901A] 06/20/2014  . Bipolar disorder [F31.9] 06/20/2014  . Drug overdose, intentional [T50.902A] 06/20/2014  . Alcohol dependence [F10.20] 06/13/2012  . Episodic mood disorder [F39] 06/13/2012   Musculoskeletal: Strength & Muscle Tone: within normal limits Gait & Station: normal Patient leans: N/A  Psychiatric Specialty Exam: Physical Exam  Psychiatric: Her speech is normal and behavior is normal. Judgment and thought content normal. Her mood appears not anxious. Her affect is not angry, not blunt, not labile and not inappropriate. Cognition and memory are normal. She does not exhibit a depressed mood.    Review of Systems  Constitutional: Negative.   HENT: Negative.   Eyes: Negative.   Respiratory: Negative.   Cardiovascular: Negative.   Gastrointestinal: Negative.   Genitourinary: Negative.   Musculoskeletal: Negative.   Skin: Negative.   Neurological: Negative.   Endo/Heme/Allergies: Negative.   Psychiatric/Behavioral: Positive for depression (Stable) and substance abuse (Alcoholism, chronic). Negative for suicidal ideas, hallucinations (Stable) and memory loss. The patient has insomnia. The patient is not nervous/anxious.     Blood pressure 101/65, pulse 95,  temperature 97.9 F (36.6 C), temperature source Oral, resp. rate 16, height 5' 6"  (1.676 m), weight 70.308 kg (155 lb), last menstrual period 05/31/2014, SpO2 97 %.Body mass index is 25.03 kg/(m^2).  See Md's SRA   Past Medical History:  Past Medical History  Diagnosis Date  . Bipolar disorder   . Anxiety     Past Surgical History  Procedure Laterality Date  . Augmentation mammaplasty Bilateral 2008  . Refractive surgery Bilateral ~ 2008   Family History:  Family History  Problem Relation Age of Onset  . Heart failure Mother   . Hypertension Mother   . Hypertension Father    Social History:  History  Alcohol Use  . 4.8 oz/week  . 8 Glasses of wine per week     History  Drug Use No    History   Social History  . Marital Status: Married    Spouse Name: N/A  . Number of Children: N/A  . Years of Education: N/A   Social History Main Topics  . Smoking status: Former Smoker -- 1.00 packs/day for 5 years    Types: Cigarettes    Quit date: 12/08/1992  . Smokeless tobacco: Never Used  . Alcohol Use: 4.8 oz/week    8 Glasses of wine per week  . Drug Use: No  . Sexual Activity: Yes     Comment: husband had vasectomy   Other Topics Concern  . None   Social History Narrative   Risk to Self: Is patient at risk for suicide?: Yes Risk to Others:   Prior Inpatient Therapy:   Prior Outpatient Therapy:    Level of Care:  OP  Hospital Course:  Janet Lewis is a 44 year old married Caucasian female who was brought to  the [facility] after overdosing on Tegretol and Trazodone. Patient drinks one pint of Vodka four times daily and reports trying to quit for past one week until yesterday when she again started drinking since the morning. She had confrontation with her husband late in the day and took a handful of trazodone and Tegretol. She reports taking the medications in anger and did not intend to kill herself. Denies suicidal ideation at this time. She had an episode of  vomiting in the evening but went off to sleep. She got up the next morning when she got up to go to work, was very unsteady and fell 3 times at home. Patient states the following during her psychiatric assessment "I am an alcoholic. I have never really stopped drinking.  Although classifying her self as an alcoholic and having been drinking a lot of alcohol, Janet Lewis's BAL upon admission was <5. She came to the hospital because she found herself very unsteady on her feet and had fallen x 3 after drinking too much alcohol. While a patient in this hospital, Janet Lewis did not receive detoxification treatments. However, she received mood stabilization treatments. She was medicated and discharged on; Hydroxyzine 25 mg prn for anxiety, Lamictal 25 mg for mood stabilization, Lithium carbonate 300 mg for mood stabilization and Trazodone 150 mg for insomnia. She was also enrolled and participated in the group counseling sessions & AA/NA meetings being offered and held on this unit. She learned coping skills. She presented no other significant medical issues that required treatment & or monitoring. She tolerated her treatment regimen without any significant adverse effects and or reactions. Her Lithium levels was 0.28.  While receiving her treatment in this hospital, Janet Lewis was evaluated each day by a clinical provider to ascertain her response to her treatment regimen. Improvement was noted by the patient's report of decreasing symptoms, improved sleep, appetite, good affect, medication tolerance and participation in the unit programming. She was asked on daily basis to complete a self inventory assessment noting mood, mental status, pain, new symptoms, anxiety and or concerns. Her answers showed that her symptoms were responding well to the ordered medication regimen and also being in a therapeutic and supportive environment assisted her progress. She is currently stabilized enough for discharge.    On this day of  her hospital discharge, Janet Lewis is in much improved condition than upon admission. She contracted for his safety and felt more in control of her thoughts, evidenced by the her statement about the need to remain in control of her mental health.Upon discharge, her symptoms were reported as significantly decreased or resolved completely. Janet Lewis currently denies any SI/HI and voiced no AVH. She was motivated to continue taking medication with a goal of continued improvement in mental health. She is being discharged to her home with a plan to follow up as noted below. The patient was provided with samples of her discharge medications  She was picked up by her parents and left [facility] in no acute distress with all belongings.  Consults:  psychiatry  Significant Diagnostic Studies:  labs: CBC with diff, CMP, UDS, toxicology tests, U/A, Lithium levels, reports reviewed, stable  Discharge Vitals:   Blood pressure 101/65, pulse 95, temperature 97.9 F (36.6 C), temperature source Oral, resp. rate 16, height 5' 6"  (1.676 m), weight 70.308 kg (155 lb), last menstrual period 05/31/2014, SpO2 97 %. Body mass index is 25.03 kg/(m^2). Lab Results:   Results for orders placed or performed during the hospital encounter of 06/24/14 (from the past  72 hour(s))  TSH     Status: None   Collection Time: 06/25/14  7:57 PM  Result Value Ref Range   TSH 3.710 0.350 - 4.500 uIU/mL    Comment: Performed at [facility]  Lithium level     Status: Abnormal   Collection Time: 06/27/14  6:35 AM  Result Value Ref Range   Lithium Lvl 0.28 (L) 0.80 - 1.40 mmol/L    Comment: Performed at [facility]   Physical Findings: AIMS: Facial and Oral Movements Muscles of Facial Expression: None, normal Lips and Perioral Area: None, normal Jaw: None, normal Tongue: None, normal,Extremity Movements Upper (arms, wrists, hands, fingers): None, normal Lower (legs, knees, ankles, toes): None, normal, Trunk  Movements Neck, shoulders, hips: None, normal, Overall Severity Severity of abnormal movements (highest score from questions above): None, normal Incapacitation due to abnormal movements: None, normal Patient's awareness of abnormal movements (rate only patient's report): No Awareness, Dental Status Current problems with teeth and/or dentures?: No Does patient usually wear dentures?: No  CIWA:  CIWA-Ar Total: 1 COWS:  COWS Total Score: 0  See Psychiatric Specialty Exam and Suicide Risk Assessment completed by Attending Physician prior to discharge.  Discharge destination:  Home  Is patient on multiple antipsychotic therapies at discharge:  No   Has Patient had three or more failed trials of antipsychotic monotherapy by history:  No  Recommended Plan for Multiple Antipsychotic Therapies: NA    Medication List    STOP taking these medications        pantoprazole 20 MG tablet  Commonly known as:  PROTONIX     sucralfate 1 G tablet  Commonly known as:  CARAFATE      TAKE these medications      Indication   hydrOXYzine 25 MG tablet  Commonly known as:  ATARAX/VISTARIL  Take 1 tablet (25 mg total) by mouth every 4 (four) hours as needed for itching. Anxiety, sleep   Indication:  Itching with Persistent Hives, Anxiety, insomnia     lamoTRIgine 25 MG tablet  Commonly known as:  LAMICTAL  Take 1 tablet (25 mg total) by mouth daily. For mood stabilization   Indication:  Manic-Depression, Mood stabilization     lithium carbonate 300 MG capsule  Take 1 capsule (300 mg total) by mouth 3 (three) times daily with meals. For mood stabilization   Indication:  Manic-Depression, Mood stabilization     traZODone 150 MG tablet  Commonly known as:  DESYREL  Take 1 tablet (150 mg total) by mouth at bedtime. For sleep   Indication:  Trouble Sleeping       Follow-up Information    Follow up with [facility].   Why:  Message left for Granville Lewis on 06/27/14 at  10:30AM in attempt to schedule hospital follow-up appt. The office will contact you directly within 24hrs to schedule this appt. If you do not get call, please call the office [phone]. Thank you!    Contact information:   [address] [place], [place] [address] Phone: [phone] Fax: [phone]      Follow up with [facility].   Why:  Patient requested to schedule her own therapy appt. Please contact office at [phone] at discharge to schedule this appt. Thank you!   Contact information:   [address] [place], [place] [address] Phone: [phone] Fax: [phone]     Follow-up recommendations:  Activity:  As tolerated Diet: As recommended by  your primary care doctor. Keep all scheduled follow-up appointments as recommended.  Comments: Take all your medications as prescribed by your mental healthcare provider. Report any adverse effects and or reactions from your medicines to your outpatient provider promptly. Patient is instructed and cautioned to not engage in alcohol and or illegal drug use while on prescription medicines. In the event of worsening symptoms, patient is instructed to call the crisis hotline, 911 and or go to the nearest ED for appropriate evaluation and treatment of symptoms. Follow-up with your primary care provider for your other medical issues, concerns and or health care needs.   Total Discharge Time: Greater than 30 minutes  Signed: Encarnacion Slates, PMHMNp, FNP-BC 06/27/2014, 2:39 PM  I personally assessed the patient and formulated the plan Janet Lewis, M.D.

## 2014-06-27 NOTE — BHH Group Notes (Signed)
[facility] LCSW Aftercare Discharge Planning Group Note   06/27/2014 10:34 AM  Participation Quality:  Appropriate   Mood/Affect:  Appropriate  Depression Rating:  4  Anxiety Rating:  5  Thoughts of Suicide:  No Will you contract for safety?   NA  Current AVH:  No  Plan for Discharge/Comments:  Pt reports that she is d/cing today.She plans to return home with her husband. Pt plans to continue seeing Granville Lewis at [employer] office and requested to make her own therapy appt with [facility]. CSW assessing. Pt reports no w/d, no issues with medications. Pt pleasant and cooperative during group.   Transportation Means: family member   Supports: some family support/minimal support provided by husband.   Smart, [employer]

## 2014-06-27 NOTE — Progress Notes (Signed)
  [facility] Adult Case Management Discharge Plan :  Will you be returning to the same living situation after discharge:  Yes,  home  At discharge, do you have transportation home?: Yes,  parent coming after lunch Do you have the ability to pay for your medications: Yes,  BC[employer]  Release of information consent forms completed and submitted to medical records by CSW.   Patient to Follow up at: Follow-up Information    Follow up with [facility].   Why:  Message left for Granville Lewis on 06/27/14 at 10:30AM in attempt to schedule hospital follow-up appt. The office will contact you directly within 24hrs to schedule this appt. If you do not get call, please call the office [phone]. Thank you!    Contact information:   [address] [place], [place] [address] Phone: [phone] Fax: [phone]      Follow up with [facility].   Why:  Patient requested to schedule her own therapy appt. Please contact office at [phone] at discharge to schedule this appt. Thank you!   Contact information:   [address] [place], [place] [address] Phone: [phone] Fax: [phone]      Patient denies SI/HI: Yes,  during group and self report.     Safety Planning and Suicide Prevention discussed: Yes,  SPE completed with pt as she refused to consent to family contact. SPE completed with pt. SPI pamphlet provided to pt and she was encouraged to share information with support network, ask questions, and talk about any concerns relating to SPE.  Have you used any form of tobacco in the last 30 days? (Cigarettes, Smokeless Tobacco, Cigars, and/or Pipes): No  Has patient been referred to the Quitline?: N/A patient is not a smoker  Smart, [place]  06/27/2014, 11:16 AM

## 2014-06-27 NOTE — BHH Suicide Risk Assessment (Signed)
[place] INPATIENT:  Family/Significant Other Suicide Prevention Education  Suicide Prevention Education:  Patient Refusal for Family/Significant Other Suicide Prevention Education: The patient Janet Lewis has refused to provide written consent for family/significant other to be provided Family/Significant Other Suicide Prevention Education during admission and/or prior to discharge.  Physician notified.  SPE completed with pt. SPI pamphlet provided to pt and she was encouraged to share information with support network, ask questions, and talk about any concerns regarding SPE. Pt reports no access to firearms. Pt denies SI and has been given mobile crisis information. Pt reports minimal family support.   Smart, Jabriel Vanduyne  LCSWA   06/27/2014, 11:14 AM

## 2014-06-27 NOTE — BHH Suicide Risk Assessment (Signed)
[facility] Discharge Suicide Risk Assessment   Demographic Factors:  Caucasian  Total Time spent with patient: 30 minutes  Musculoskeletal: Strength & Muscle Tone: within normal limits Gait & Station: normal Patient leans: N/A  Psychiatric Specialty Exam: Physical Exam  Review of Systems  Constitutional: Negative.   HENT: Negative.   Eyes: Negative.   Respiratory: Negative.   Cardiovascular: Negative.   Gastrointestinal: Negative.   Genitourinary: Negative.   Musculoskeletal: Negative.   Skin: Negative.   Neurological: Negative.   Endo/Heme/Allergies: Negative.   Psychiatric/Behavioral: Positive for depression and substance abuse.    Blood pressure 101/65, pulse 95, temperature 97.9 F (36.6 C), temperature source Oral, resp. rate 16, height 5' 6"  (1.676 m), weight 70.308 kg (155 lb), last menstrual period 05/31/2014, SpO2 97 %.Body mass index is 25.03 kg/(m^2).  General Appearance: Fairly Groomed  Engineer, water::  Fair  Speech:  Clear and [id number]  Volume:  Normal  Mood:  Anxious  Affect:  worried  Thought Process:  Coherent and Goal Directed  Orientation:  Full (Time, Place, and Person)  Thought Content:  plans as she moves on, relapse prevention plan  Suicidal Thoughts:  No  Homicidal Thoughts:  No  Memory:  Immediate;   Fair Recent;   Fair Remote;   Fair  Judgement:  Fair  Insight:  Present  Psychomotor Activity:  Normal  Concentration:  Fair  Recall:  [employer] [place]  Language: Fair  Akathisia:  No  Handed:  Right  AIMS (if indicated):     Assets:  Desire for Improvement Housing Social Support Vocational/Educational  Sleep:  Number of Hours: 6  Cognition: WNL  ADL's:  Intact   Have you used any form of tobacco in the last 30 days? (Cigarettes, Smokeless Tobacco, Cigars, and/or Pipes): No  Has this patient used any form of tobacco in the last 30 days? (Cigarettes, Smokeless Tobacco, Cigars, and/or Pipes) No  Mental Status Per Nursing  Assessment::   On Admission:     Current Mental Status by Physician: In full contact with reality. There are no active S/S of withdrawal. There are no active SI plans or intent. She is tolerating her medications well. She is committed to abstinence " I have to do this" states that her mother is very supportive and her husband will be supportive as long as she does not drink. She is planning to follow up with outpatient psychotherapy   Loss Factors: NA  Historical Factors: Impulsivity  Risk Reduction Factors:   Sense of responsibility to family, Employed, Living with another person, especially a relative and Positive social support  Continued Clinical Symptoms:  Bipolar Disorder:   Bipolar II Depressive phase Alcohol/Substance Abuse/Dependencies  Cognitive Features That Contribute To Risk:  Closed-mindedness, Polarized thinking and Thought constriction (tunnel vision)    Suicide Risk:  Minimal: No identifiable suicidal ideation.  Patients presenting with no risk factors but with morbid ruminations; may be classified as minimal risk based on the severity of the depressive symptoms  Principal Problem: Alcohol dependence Discharge Diagnoses:  Patient Active Problem List   Diagnosis Date Noted  . Alcohol dependence [F10.20] 06/13/2012    Priority: High  . Bipolar 1 disorder, depressed, severe [F31.4] 06/24/2014  . Alcohol abuse [F10.10]   . Tegretol toxicity [T42.1X1A] 06/20/2014  . Drug overdose [T50.901A] 06/20/2014  . Bipolar disorder [F31.9] 06/20/2014  . Drug overdose, intentional [T50.902A] 06/20/2014  . Episodic mood disorder [F39] 06/13/2012    Follow-up Information    Follow up with [facility].   Why:  Message left for Granville Lewis on 06/27/14 at 10:30AM in attempt to schedule hospital follow-up appt. The office will contact you directly within 24hrs to schedule this appt. If you do not get call, please call the office [phone]. Thank you!     Contact information:   [address] [place], [place] [address] Phone: [phone] Fax: [phone]      Follow up with [facility].   Why:  Patient requested to schedule her own therapy appt. Please contact office at [phone] at discharge to schedule this appt. Thank you!   Contact information:   [address] [place], [place] [address] Phone: [phone] Fax: [phone]      Plan Of Care/Follow-up recommendations:  Activity:  as tolerated Diet:  regular Follow up Leanna Battles and Debbora Dus NP Is patient on multiple antipsychotic therapies at discharge:  No   Has Patient had three or more failed trials of antipsychotic monotherapy by history:  No  Recommended Plan for Multiple Antipsychotic Therapies: NA    Kyrsten Deleeuw A 06/27/2014, 1:24 PM

## 2014-06-29 NOTE — Progress Notes (Signed)
Patient Discharge Instructions:  After Visit Summary (AVS):   Faxed to:  06/29/14 Discharge Summary Note:   Faxed to:  06/29/14 Psychiatric Admission Assessment Note:   Faxed to:  06/29/14 Suicide Risk Assessment - Discharge Assessment:   Faxed to:  06/29/14 Faxed/Sent to the Next Level Care provider:  06/29/14  Faxed to [facility] @ [phone] Faxed to [facility] @ [phone]   Patsey Berthold, 06/29/2014, 3:33 PM

## 2014-07-02 ENCOUNTER — Emergency Department (HOSPITAL_COMMUNITY)
Admission: EM | Admit: 2014-07-02 | Discharge: 2014-07-03 | Disposition: A | Discharge status: Home / Self Care | Payer: BLUE CROSS/BLUE SHIELD | Attending: Emergency Medicine | Admitting: Emergency Medicine

## 2014-07-02 ENCOUNTER — Encounter (HOSPITAL_COMMUNITY): Payer: Self-pay | Admitting: *Deleted

## 2014-07-02 DIAGNOSIS — F1010 Alcohol abuse, uncomplicated: Secondary | ICD-10-CM | POA: Insufficient documentation

## 2014-07-02 DIAGNOSIS — Z79899 Other long term (current) drug therapy: Secondary | ICD-10-CM | POA: Insufficient documentation

## 2014-07-02 DIAGNOSIS — R21 Rash and other nonspecific skin eruption: Secondary | ICD-10-CM | POA: Diagnosis not present

## 2014-07-02 DIAGNOSIS — Z87891 Personal history of nicotine dependence: Secondary | ICD-10-CM | POA: Diagnosis not present

## 2014-07-02 DIAGNOSIS — F419 Anxiety disorder, unspecified: Secondary | ICD-10-CM | POA: Diagnosis not present

## 2014-07-02 DIAGNOSIS — R197 Diarrhea, unspecified: Secondary | ICD-10-CM | POA: Insufficient documentation

## 2014-07-02 DIAGNOSIS — R4589 Other symptoms and signs involving emotional state: Secondary | ICD-10-CM

## 2014-07-02 DIAGNOSIS — R45851 Suicidal ideations: Secondary | ICD-10-CM | POA: Diagnosis present

## 2014-07-02 DIAGNOSIS — F10220 Alcohol dependence with intoxication, uncomplicated: Secondary | ICD-10-CM | POA: Diagnosis present

## 2014-07-02 DIAGNOSIS — Z3202 Encounter for pregnancy test, result negative: Secondary | ICD-10-CM | POA: Diagnosis not present

## 2014-07-02 DIAGNOSIS — Y908 Blood alcohol level of 240 mg/100 ml or more: Secondary | ICD-10-CM | POA: Diagnosis not present

## 2014-07-02 DIAGNOSIS — F319 Bipolar disorder, unspecified: Secondary | ICD-10-CM | POA: Insufficient documentation

## 2014-07-02 LAB — COMPREHENSIVE METABOLIC PANEL
ALK PHOS: 58 U/L (ref 39–117)
ALT: 46 U/L — AB (ref 0–35)
AST: 37 U/L (ref 0–37)
Albumin: 4.5 g/dL (ref 3.5–5.2)
Anion gap: 6 (ref 5–15)
BILIRUBIN TOTAL: 0.5 mg/dL (ref 0.3–1.2)
BUN: 7 mg/dL (ref 6–23)
CHLORIDE: 113 mmol/L — AB (ref 96–112)
CO2: 24 mmol/L (ref 19–32)
Calcium: 8.7 mg/dL (ref 8.4–10.5)
Creatinine, Ser: 0.65 mg/dL (ref 0.50–1.10)
GFR calc non Af Amer: >90 mL/min (ref >90)
GLUCOSE: 103 mg/dL — AB (ref 70–99)
POTASSIUM: 3.6 mmol/L (ref 3.5–5.1)
SODIUM: 143 mmol/L (ref 135–145)
TOTAL PROTEIN: 7.7 g/dL (ref 6.0–8.3)

## 2014-07-02 LAB — CBC
HCT: 42.8 % (ref 36.0–46.0)
Hemoglobin: 14.5 g/dL (ref 12.0–15.0)
MCH: 34.4 pg — AB (ref 26.0–34.0)
MCHC: 33.9 g/dL (ref 30.0–36.0)
MCV: 101.4 fL — AB (ref 78.0–100.0)
PLATELETS: 302 10*3/uL (ref 150–400)
RBC: 4.22 MIL/uL (ref 3.87–5.11)
RDW: 13.3 % (ref 11.5–15.5)
WBC: 8.2 10*3/uL (ref 4.0–10.5)

## 2014-07-02 LAB — SALICYLATE LEVEL

## 2014-07-02 LAB — RAPID URINE DRUG SCREEN, HOSP PERFORMED
Amphetamines: NOT DETECTED
BARBITURATES: NOT DETECTED
Benzodiazepines: NOT DETECTED
COCAINE: NOT DETECTED
Opiates: NOT DETECTED
TETRAHYDROCANNABINOL: NOT DETECTED

## 2014-07-02 LAB — POC URINE PREG, ED: Preg Test, Ur: NEGATIVE

## 2014-07-02 LAB — ETHANOL: ALCOHOL ETHYL (B): 267 mg/dL — AB (ref 0–9)

## 2014-07-02 LAB — LITHIUM LEVEL: Lithium Lvl: 0.48 mmol/L — ABNORMAL LOW (ref 0.80–1.40)

## 2014-07-02 LAB — ACETAMINOPHEN LEVEL: Acetaminophen (Tylenol), Serum: <10 ug/mL — ABNORMAL LOW (ref 10–30)

## 2014-07-02 MED ORDER — IBUPROFEN 200 MG PO TABS: 600.0000 mg | ORAL_TABLET | Freq: Three times a day (TID) | ORAL | Status: DC | PRN

## 2014-07-02 MED ORDER — LORAZEPAM 2 MG/ML IJ SOLN: 0.0000 mg | Freq: Four times a day (QID) | INTRAMUSCULAR | Status: DC

## 2014-07-02 MED ORDER — ONDANSETRON HCL 4 MG PO TABS: 4.0000 mg | ORAL_TABLET | Freq: Three times a day (TID) | ORAL | Status: DC | PRN

## 2014-07-02 MED ORDER — VITAMIN B-1 100 MG PO TABS
100.0000 mg | ORAL_TABLET | Freq: Every day | ORAL | Status: DC
  Administered 2014-07-02: 100 mg via ORAL
  Filled 2014-07-02: qty 1

## 2014-07-02 MED ORDER — LAMOTRIGINE 25 MG PO TABS
25.0000 mg | ORAL_TABLET | Freq: Every day | ORAL | Status: DC
  Administered 2014-07-02: 25 mg via ORAL
  Filled 2014-07-02 (×2): qty 1

## 2014-07-02 MED ORDER — ZOLPIDEM TARTRATE 5 MG PO TABS
5.0000 mg | ORAL_TABLET | Freq: Every evening | ORAL | Status: DC | PRN
  Administered 2014-07-02: 5 mg via ORAL
  Filled 2014-07-02: qty 1

## 2014-07-02 MED ORDER — LORAZEPAM 2 MG/ML IJ SOLN: 0.0000 mg | Freq: Two times a day (BID) | INTRAMUSCULAR | Status: DC

## 2014-07-02 MED ORDER — ACETAMINOPHEN 325 MG PO TABS: 650.0000 mg | ORAL_TABLET | ORAL | Status: DC | PRN

## 2014-07-02 MED ORDER — LORAZEPAM 1 MG PO TABS
0.0000 mg | ORAL_TABLET | Freq: Four times a day (QID) | ORAL | Status: DC
  Administered 2014-07-03: 1 mg via ORAL

## 2014-07-02 MED ORDER — ALUM & MAG HYDROXIDE-SIMETH 200-200-20 MG/5ML PO SUSP: 30.0000 mL | ORAL | Status: DC | PRN

## 2014-07-02 MED ORDER — LORAZEPAM 1 MG PO TABS
0.0000 mg | ORAL_TABLET | Freq: Two times a day (BID) | ORAL | Status: DC
  Administered 2014-07-02: 2 mg via ORAL
  Filled 2014-07-02: qty 2
  Filled 2014-07-02: qty 1

## 2014-07-02 MED ORDER — LITHIUM CARBONATE 300 MG PO CAPS
300.0000 mg | ORAL_CAPSULE | Freq: Three times a day (TID) | ORAL | Status: DC
  Administered 2014-07-02 – 2014-07-03 (×2): 300 mg via ORAL
  Filled 2014-07-02 (×2): qty 1

## 2014-07-02 MED ORDER — THIAMINE HCL 100 MG/ML IJ SOLN: 100.0000 mg | Freq: Every day | INTRAMUSCULAR | Status: DC

## 2014-07-02 NOTE — ED Provider Notes (Signed)
CSN: [id number]     Arrival date & time 07/02/14  1254 History   First MD Initiated Contact with Patient 07/02/14 1303     Chief Complaint  Patient presents with  . Suicidal     (Consider location/radiation/quality/duration/timing/severity/associated sxs/prior Treatment) HPI Comments: Janet Lewis is a 44 y.o. female with a PMHx of bipolar disorder, alcoholism, and depression with recent suicide attempt on 06/20/14 (attempted to OD on home meds), who presents to the ED with complaints of alcohol intoxication today. She states that she was on her way to a rehabilitation facility in [place], and when she was at the airport she bought 1 pint of vodka and began drinking it prior to her flight. She states this was at sometime prior to 11:30 AM, but she is unsure of the exact time. She is unsure of the exact amount she consumed. She does not recall all events leading up to her being brought here, simply states she "drank too much" and "ended up here". She is very tearful and continues to state she "needs to get to rehab or (she'll) die". Denies active suicidal or homicidal ideations, but nursing reports state that she was suicidal upon arrival. She continues to report that if she doesn't go to rehabilitation she believes that her "family will disown her and she will die". She has been compliant with her medications since leaving behavioral health several days ago, and her lithium this morning, denying any additional doses were overdoses taken. She states that for the last 2 days she has felt lightheaded and believes this is due to lithium combined with alcohol. She states that last night she had visual hallucinations of a man standing in her doorway, but denies any ongoing hallucinations. She states that she has not slept since Saturday at 8 AM, because she was taken off her Trazodone after the OD attempt recently. She feels "shaky" and has had 4 episodes of loose stools, which she states is consistent with her  withdrawal symptoms. 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 Reports being compliant on all meds, and denies illicit drug use or smoking use. States she has a rash chronically and sees dermatology for it, with no acute changes in this. Denies fevers, chills, CP, SOB, abd pain, n/v, constipation, obstipation, melena, hematochezia, dysuria, hematuria, vaginal bleeding/discharge, myalgias, arthralgias, numbness, tingling, weakness, vision changes, or headaches. LMP 2wks ago.   Patient is a 44 y.o. female presenting with intoxication. The history is provided by the patient. No language interpreter was used.  Alcohol Intoxication This is a chronic problem. The current episode started today. The problem occurs every several days. The problem has been unchanged. Associated symptoms include a rash (chronic, unchanged, full body). Pertinent negatives include no abdominal pain, arthralgias, chest pain, chills, coughing, fever, headaches, myalgias, nausea, numbness, urinary symptoms, vertigo, visual change, vomiting or weakness. The symptoms are aggravated by drinking. She has tried nothing for the symptoms. The treatment provided no relief.    Past Medical History  Diagnosis Date  . Bipolar disorder   . Anxiety    Past Surgical History  Procedure Laterality Date  . Augmentation mammaplasty Bilateral 2008  . Refractive surgery Bilateral ~ 2008   Family History  Problem Relation Age of Onset  . Heart failure Mother   . Hypertension Mother   . Hypertension Father    History  Substance Use Topics  . Smoking status: Former Smoker -- 1.00 packs/day for 5 years    Types: Cigarettes  Quit date: 12/08/1992  . Smokeless tobacco: Never Used  . Alcohol Use: 4.8 oz/week    8 Glasses of wine per week   OB History    No data available     Review of Systems  Constitutional: Negative for fever and chills.  Respiratory: Negative for cough and shortness of breath.   Cardiovascular: Negative for  chest pain.  Gastrointestinal: Positive for diarrhea (4 watery stools today, part of her alcohol withdrawal symptoms). Negative for nausea, vomiting, abdominal pain, constipation and blood in stool.  Genitourinary: Negative for dysuria, hematuria, vaginal bleeding and vaginal discharge.  Musculoskeletal: Negative for myalgias and arthralgias.  Skin: Positive for rash (chronic, unchanged, full body). Negative for color change.  Allergic/Immunologic: Negative for immunocompromised state.  Neurological: Positive for light-headedness (since starting lithium again and consuming alcohol). Negative for vertigo, weakness, numbness and headaches.  Psychiatric/Behavioral: Positive for hallucinations (last night, none now) and sleep disturbance. Negative for confusion. The patient is nervous/anxious.    10 Systems reviewed and are negative for acute change except as noted in the HPI.    Allergies  Codeine; Sulfa antibiotics; and Neosporin  Home Medications   Prior to Admission medications   Medication Sig Start Date End Date Taking? Authorizing Provider  hydrOXYzine (ATARAX/VISTARIL) 25 MG tablet Take 1 tablet (25 mg total) by mouth every 4 (four) hours as needed for itching. Anxiety, sleep 06/27/14   Encarnacion Slates, NP  lamoTRIgine (LAMICTAL) 25 MG tablet Take 1 tablet (25 mg total) by mouth daily. For mood stabilization 06/27/14   Encarnacion Slates, NP  lithium carbonate 300 MG capsule Take 1 capsule (300 mg total) by mouth 3 (three) times daily with meals. For mood stabilization 06/27/14   Encarnacion Slates, NP  traZODone (DESYREL) 150 MG tablet Take 1 tablet (150 mg total) by mouth at bedtime. For sleep 06/27/14   Encarnacion Slates, NP   BP 135/78 mmHg  Pulse 99  Temp(Src) 97.9 F (36.6 C) (Oral)  Resp 18  SpO2 100%  LMP 05/31/2014 Physical Exam  Constitutional: She is oriented to person, place, and time. Vital signs are normal. She appears well-developed and well-nourished.  Non-toxic appearance. She appears  distressed (tearful).  Afebrile, nontoxic, tearful and anxious appearing. VSS  HENT:  Head: Normocephalic and atraumatic.  Mouth/Throat: Oropharynx is clear and moist and mucous membranes are normal.  Eyes: Conjunctivae and EOM are normal. Right eye exhibits no discharge. Left eye exhibits no discharge.  Neck: Normal range of motion. Neck supple.  Cardiovascular: Normal rate, regular rhythm, normal heart sounds and intact distal pulses.  Exam reveals no gallop and no friction rub.   No murmur heard. Pulmonary/Chest: Effort normal and breath sounds normal. No respiratory distress. She has no decreased breath sounds. She has no wheezes. She has no rhonchi. She has no rales.  Abdominal: Soft. Normal appearance and bowel sounds are normal. She exhibits no distension. There is no tenderness. There is no rigidity, no rebound, no guarding, no CVA tenderness, no tenderness at McBurney's point and negative Murphy's sign.  Musculoskeletal: Normal range of motion.  MAE x4 Strength and sensation grossly intact Distal pulses intact Gait steady  Neurological: She is alert and oriented to person, place, and time. She has normal strength. No cranial nerve deficit or sensory deficit. Coordination and gait normal. GCS eye subscore is 4. GCS verbal subscore is 5. GCS motor subscore is 6.  CN 2-12 grossly intact A&O x4 GCS 15 Sensation and strength intact Gait nonataxic Coordination  with finger-to-nose WNL Goal-oriented speech  Skin: Skin is warm, dry and intact. No rash noted.  Psychiatric: Her mood appears anxious. She expresses no suicidal plans and no homicidal plans.  Anxious and tearful, denying SI/HI/AVH at this time but states she "feels like she'll die" if she doesn't get to rehab, and states she had visual hallucinations last night  Nursing note and vitals reviewed.   ED Course  Procedures (including critical care time) Labs Review Labs Reviewed  ACETAMINOPHEN LEVEL - Abnormal; Notable for  the following:    Acetaminophen (Tylenol), Serum <10.0 (*)    All other components within normal limits  CBC - Abnormal; Notable for the following:    MCV 101.4 (*)    MCH 34.4 (*)    All other components within normal limits  COMPREHENSIVE METABOLIC PANEL - Abnormal; Notable for the following:    Chloride 113 (*)    Glucose, Bld 103 (*)    ALT 46 (*)    All other components within normal limits  ETHANOL - Abnormal; Notable for the following:    Alcohol, Ethyl (B) 267 (*)    All other components within normal limits  SALICYLATE LEVEL  URINE RAPID DRUG SCREEN (HOSP PERFORMED)  LITHIUM LEVEL  POC URINE PREG, ED    Imaging Review No results found.   EKG Interpretation None      MDM   Final diagnoses:  Alcohol abuse  Anxious appearance    44 y.o. female here for SI and EtOH use on her way to rehab. Denies SI to me, but states she "thinks she might die" if she doesn't get to rehab. Very tearful and anxious. Nonfocal neuro exam, no physical exam abnormalities. Recently discharged after OD attempt. Will get screening labs and discuss with behavioral health team, pt could potentially be discharged with outpt resources but she has hx of w/d seizures therefore would need to be detoxed before discharge since she has a hx of withdrawal seizures. Will reassess shortly.   3:08 PM Upreg neg. APAP and salicylate levels WNL. CBC WNL. CMP with mildly elevated ALT likely from EtOH. EtOH level 267. UDS WNL. Will consult TTS at this time and discuss options of inpatient detox vs discharging home. CIWA ordered.   3:51 PM TTS consulted, agreed with monitoring until no longer intoxicated and then evaluating need for psychiatric care inpatient vs outpt after that. Home meds reordered (aside from trazodone and vistaril since this was discontinued due to recent OD). Please see TTS/behavioral health team notes for further documentation of care. Pt stable at this time. Holding orders placed.  BP  121/62 mmHg  Pulse 98  Temp(Src) 97.9 F (36.6 C) (Oral)  Resp 18  SpO2 100%  LMP 05/31/2014  Meds ordered this encounter  Medications  . LORazepam (ATIVAN) tablet 0-4 mg    Sig:     Order Specific Question:  CIWA-AR < 5 =    Answer:  0 mg    Order Specific Question:  CIWA-AR 5 -10 =    Answer:  1 mg    Order Specific Question:  CIWA-AR 11 -15 =    Answer:  2 mg    Order Specific Question:  CIWA-AR 16 -24 =    Answer:  2 mg    Order Specific Question:  CIWA-AR 16 -24 =    Answer:  Recheck CIWA-AR in 1 hour; if > 15 notify MD    Order Specific Question:  CIWA-AR > 24 =    Answer:  4 mg    Order Specific Question:  CIWA-AR > 24 =    Answer:  Call Rapid Response  . LORazepam (ATIVAN) tablet 0-4 mg    Sig:     Order Specific Question:  CIWA-AR < 5 =    Answer:  0 mg    Order Specific Question:  CIWA-AR 5 -10 =    Answer:  1 mg    Order Specific Question:  CIWA-AR 11 -15 =    Answer:  2 mg    Order Specific Question:  CIWA-AR 16 -24 =    Answer:  2 mg    Order Specific Question:  CIWA-AR 16 -24 =    Answer:  Recheck CIWA-AR in 1 hour; if > 15 notify MD    Order Specific Question:  CIWA-AR > 24 =    Answer:  4 mg    Order Specific Question:  CIWA-AR > 24 =    Answer:  Call Rapid Response  . LORazepam (ATIVAN) injection 0-4 mg    Sig:     Order Specific Question:  CIWA-AR < 5 =    Answer:  0 mg    Order Specific Question:  CIWA-AR 5 -10 =    Answer:  1 mg    Order Specific Question:  CIWA-AR 11 -15 =    Answer:  2 mg    Order Specific Question:  CIWA-AR 16 -24 =    Answer:  2 mg    Order Specific Question:  CIWA-AR 16 -24 =    Answer:  Recheck CIWA-AR in 1 hour; if > 15 notify MD    Order Specific Question:  CIWA-AR > 24 =    Answer:  4 mg    Order Specific Question:  CIWA-AR > 24 =    Answer:  Call Rapid Response  . LORazepam (ATIVAN) injection 0-4 mg    Sig:     Order Specific Question:  CIWA-AR < 5 =    Answer:  0 mg    Order Specific Question:  CIWA-AR 5 -10  =    Answer:  1 mg    Order Specific Question:  CIWA-AR 11 -15 =    Answer:  2 mg    Order Specific Question:  CIWA-AR 16 -24 =    Answer:  2 mg    Order Specific Question:  CIWA-AR 16 -24 =    Answer:  Recheck CIWA-AR in 1 hour; if > 15 notify MD    Order Specific Question:  CIWA-AR > 24 =    Answer:  4 mg    Order Specific Question:  CIWA-AR > 24 =    Answer:  Call Rapid Response  . thiamine (VITAMIN B-1) tablet 100 mg    Sig:   . thiamine (B-1) injection 100 mg    Sig:   . lamoTRIgine (LAMICTAL) tablet 25 mg    Sig:     For mood stabilization  . lithium carbonate capsule 300 mg    Sig:   . alum & mag hydroxide-simeth (MAALOX/MYLANTA) 200-200-20 MG/5ML suspension 30 mL    Sig:   . ondansetron (ZOFRAN) tablet 4 mg    Sig:   . zolpidem (AMBIEN) tablet 5 mg    Sig:   . ibuprofen (ADVIL,MOTRIN) tablet 600 mg    Sig:   . acetaminophen (TYLENOL) tablet 650 mg    Sig:      Elesia Pemberton Camprubi-Soms, PA-C 07/02/14 1555  Varney Biles, MD 07/03/14 [phone]

## 2014-07-02 NOTE — ED Notes (Addendum)
Patient approaching RN demanding that RN speak to her physician on the phone who wants her to leave at this time on a plane. RN spoke with social worker who states that she will review pt records and speak with MD if needed. SW in room to speak to pt

## 2014-07-02 NOTE — ED Notes (Signed)
Pt's mother: Glory Buff [phone].

## 2014-07-02 NOTE — BHH Counselor (Addendum)
Pt signed consent to release info for [facility] in [facility] and pt requested writer call the treatment center. Writer left msg for Joylene John at [facility] at [phone] with request for call back to either [phone] or [phone]. Writer explained on [employer] that pt missed her flight today to [place] d/t alcohol use and that pt would likely be d/c from [facility] in am 3/8. Writer asked if [facility] was able to hold pt's bed for her until tomorrow late afternoon/evening. Alternate # [phone] also went to [facility] voicemail. Writer updated pt.   Arnold Long, [place] Therapeutic Triage Specialist

## 2014-07-02 NOTE — ED Notes (Signed)
Bed: [facility]61 Expected date:  Expected time:  Means of arrival:  Comments: EMS Lithium OD

## 2014-07-02 NOTE — ED Notes (Addendum)
Per EMS, pt told a police officer she was suicidal. Pt was at the airport to go to [place] for rehab, pt drank vodka and took her normal dose of lithium and was not allowed to fly. Pt attempted suicide 1 week ago by overdosing on her medication, pt was seen at [facility]. Pt has hx of bipolar disorder, depression, alcoholism. Pt is in distress due to not being able to fly, states "if I don't make this flight my life is over and my family is done". Pt states she would rather be dead than live like she is currently, denies having plan to hurt herself.

## 2014-07-02 NOTE — ED Notes (Signed)
Pt with dull, flat affect endorsing depression. Pt cooperative with staff. Meal and soda given to pt on request. No s/s of distress noted.

## 2014-07-02 NOTE — Progress Notes (Signed)
CSW was notified by nurse that pt was on the phone with whom she stated was her doctor.  CSW met with pt at bedside. Pt states that she is not SI. However, pt does appear to be overwhelmed. Pt states that she has a bed offer from a rehab in [place] called "[facility]".  Pt states that she is worried that she lose her bed due to her being here at [facility].   Pt states that she will not leave [facility] against medical advice. Pt gave CSW permission to reach out to Kevin of [facility] to inquire about the bed status.   CSW reached out to Kevin. However, he did not answer the phone. CSW left a message requesting a call back.   Currently pt is in her room watching television. CSW will continue to monitor pt.   Kevin/[facility] [phone]   , LCSWA [phone] ED CSW 07/02/2014 8:50 PM   

## 2014-07-02 NOTE — ED Notes (Signed)
TTS at bedside. 
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
## 2014-07-03 DIAGNOSIS — F10220 Alcohol dependence with intoxication, uncomplicated: Secondary | ICD-10-CM

## 2014-07-03 MED ORDER — LORAZEPAM 2 MG/ML IJ SOLN: 0.0000 mg | Freq: Four times a day (QID) | INTRAMUSCULAR | Status: DC

## 2014-07-03 NOTE — Progress Notes (Signed)
Pt spoke with [facility] in [place] confirming pt still has bed for rehab. Pt requested letter stating that patient was in the ED to assist with transfer of her plane ticket. Pt provided with cab voucher to air port. Pt psychiatrically stable per psychiatrist and np. Pt denies SI/HI/Ah/VH.   Belia Heman, [place] Work  [employer] [phone]
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
## 2014-07-03 NOTE — BHH Suicide Risk Assessment (Cosign Needed)
Suicide Risk Assessment  Discharge Assessment   [facility] Discharge Suicide Risk Assessment   Demographic Factors:  Caucasian  Total Time spent with patient: 20 minutes  Musculoskeletal: Strength & Muscle Tone: within normal limits Gait & Station: normal Patient leans: N/A  Psychiatric Specialty Exam:     Blood pressure 115/68, pulse 58, temperature 98.7 F (37.1 C), temperature source Oral, resp. rate 18, last menstrual period 05/31/2014, SpO2 100 %.There is no weight on file to calculate BMI.  General Appearance: Casual  Eye Contact::  Good  Speech:  Clear and Coherent and Normal Rate409  Volume:  Normal  Mood:  Anxious  Affect:  Congruent  Thought Process:  Coherent, Goal Directed and Intact  Orientation:  Full (Time, Place, and Person)  Thought Content:  WDL  Suicidal Thoughts:  No  Homicidal Thoughts:  No  Memory:  Immediate;   Good Recent;   Good Remote;   Good  Judgement:  Good  Insight:  Good  Psychomotor Activity:  Normal  Concentration:  Good  Recall:  NA  Fund of Knowledge:Good  Language: Good  Akathisia:  NA  Handed:  Right  AIMS (if indicated):     Assets:  Desire for Improvement  Sleep:     Cognition: WNL  ADL's:  Intact      Has this patient used any form of tobacco in the last 30 days? (Cigarettes, Smokeless Tobacco, Cigars, and/or Pipes) N/A  Mental Status Per Nursing Assessment::   On Admission:     Current Mental Status by Physician: NA  Loss Factors: NA  Historical Factors: Prior suicide attempts  Risk Reduction Factors:   Responsible for children under 49 years of age, Sense of responsibility to family, Living with another person, especially a relative, Positive social support, Positive therapeutic relationship and Positive coping skills or problem solving skills  Continued Clinical Symptoms:  Bipolar Disorder:   Depressive phase Depression:   Insomnia  Cognitive Features That Contribute To Risk:  Polarized thinking    Suicide  Risk:  Minimal: No identifiable suicidal ideation.  Patients presenting with no risk factors but with morbid ruminations; may be classified as minimal risk based on the severity of the depressive symptoms  Principal Problem: Alcohol dependence with uncomplicated intoxication Discharge Diagnoses:  Patient Active Problem List   Diagnosis Date Noted  . Alcohol dependence with uncomplicated intoxication [F10.220]     Priority: High  . Bipolar 1 disorder, depressed, severe [F31.4] 06/24/2014  . Alcohol abuse [F10.10]   . Tegretol toxicity [T42.1X1A] 06/20/2014  . Drug overdose [T50.901A] 06/20/2014  . Bipolar disorder [F31.9] 06/20/2014  . Drug overdose, intentional [T50.902A] 06/20/2014  . Alcohol dependence [F10.20] 06/13/2012  . Episodic mood disorder [F39] 06/13/2012      Plan Of Care/Follow-up recommendations:  Activity:  AS TOLERATED Diet:  REGULAR  Is patient on multiple antipsychotic therapies at discharge:  No   Has Patient had three or more failed trials of antipsychotic monotherapy by history:  No  Recommended Plan for Multiple Antipsychotic Therapies: NA    Charmaine Downs, C   PMHNP-BC 07/03/2014, 12:38 PM

## 2016-06-25 IMAGING — CT CT HEAD W/O CM
2 of 5 series · 11 of 47 positions shown, 13 images · non-contrast
Comparison: None.

CLINICAL DATA: Fall with tremors and jerking. History of
alcoholism.

EXAM:
CT HEAD WITHOUT CONTRAST
CT CERVICAL SPINE WITHOUT CONTRAST
TECHNIQUE: Multidetector CT imaging of the head and cervical spine was
performed following the standard protocol without intravenous
contrast. Multiplanar CT image reconstructions of the cervical spine
were also generated.

[Series 7: coronals · coronal · 0.43mm/px · 3 of 46 slices shown]
[im 16/46  brain]
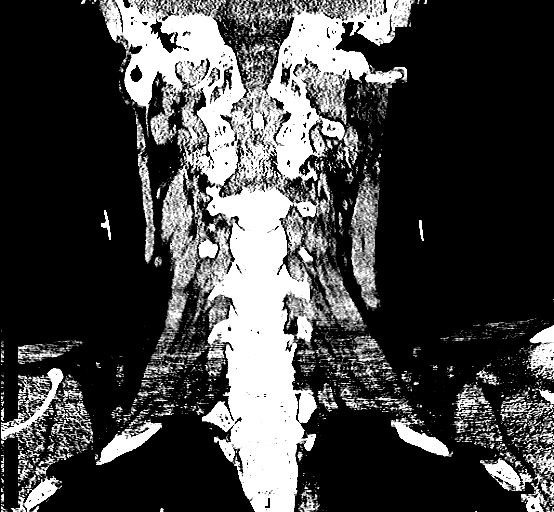
[im 21/46  brain]
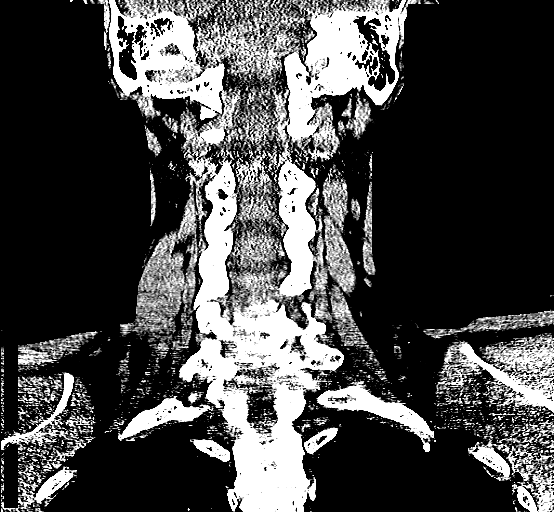
[im 26/46  brain]
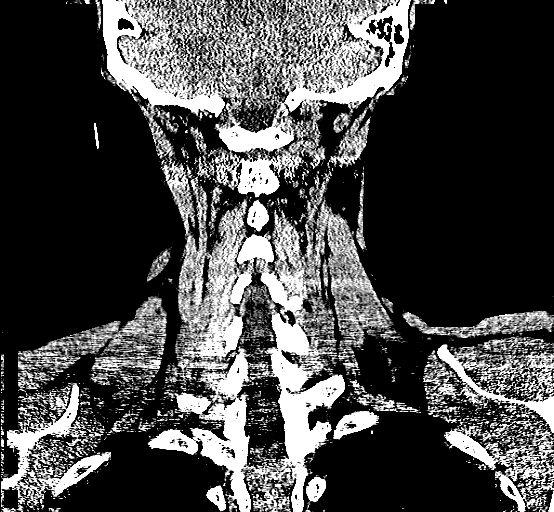

[Series 9: orthogonals · axial · 0.22mm/px · z∈[-293,-146]mm · 8 of 97 slices shown, 10 images]
[im 9/97  brain]
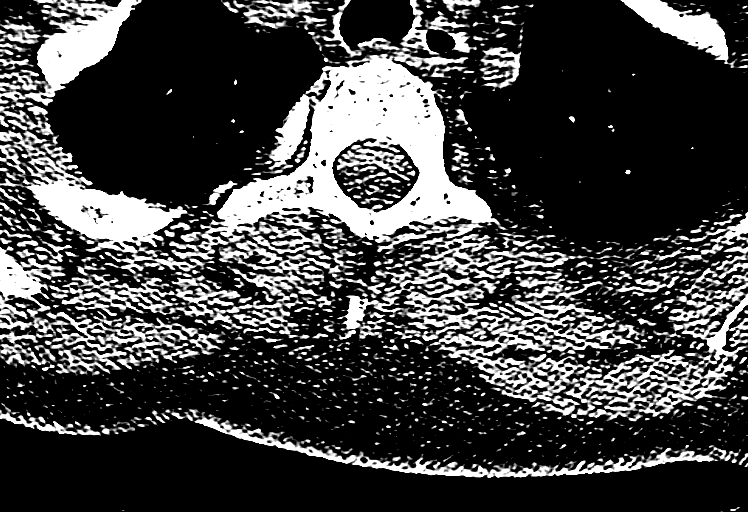
[im 9/97  bone]
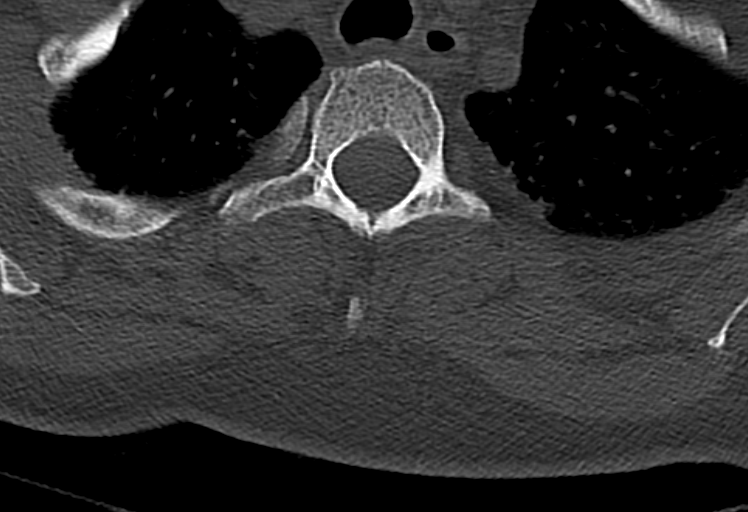
[im 18/97  brain]
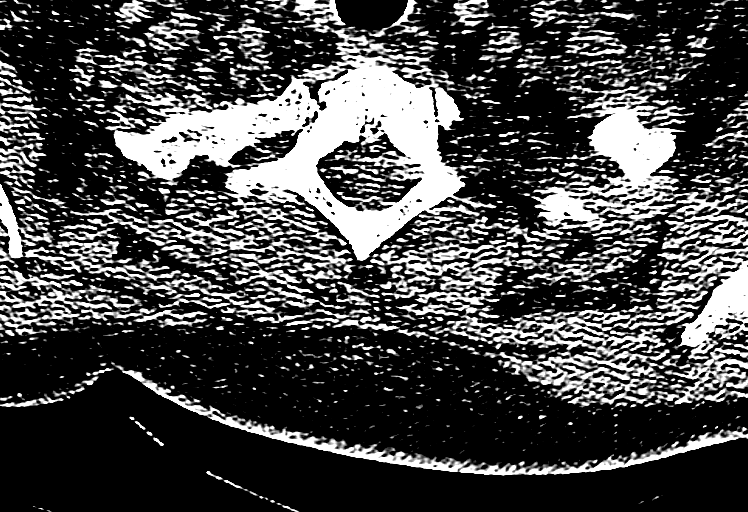
[im 35/97  brain]
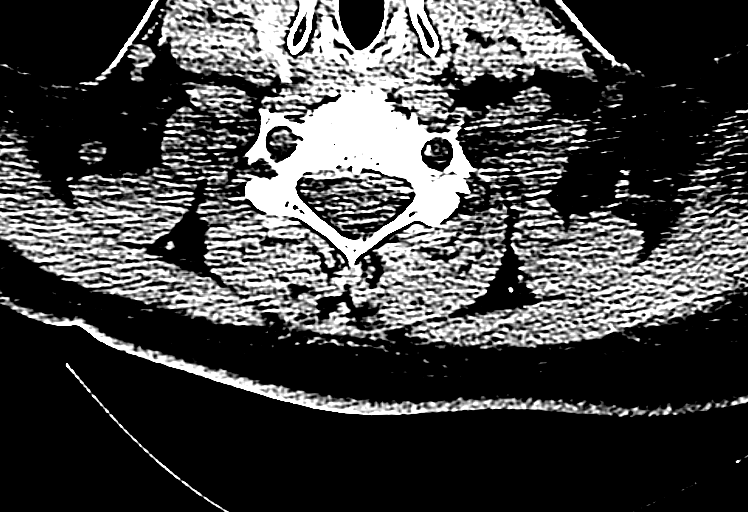
[im 44/97  brain]
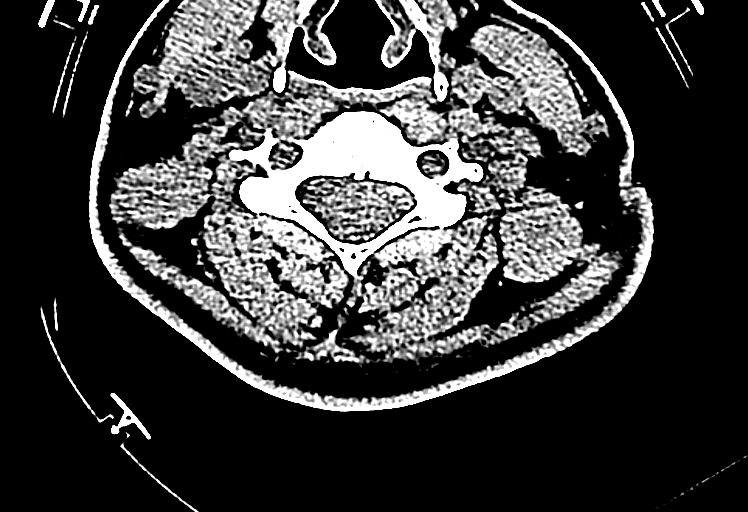
[im 53/97  brain]
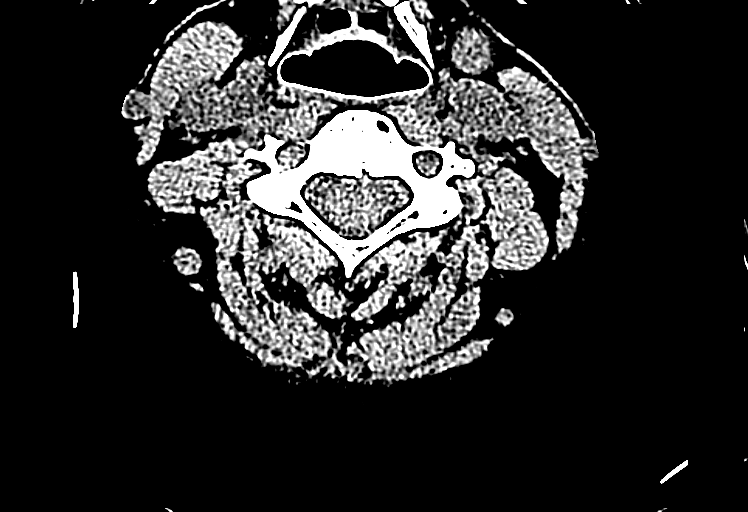
[im 53/97  bone]
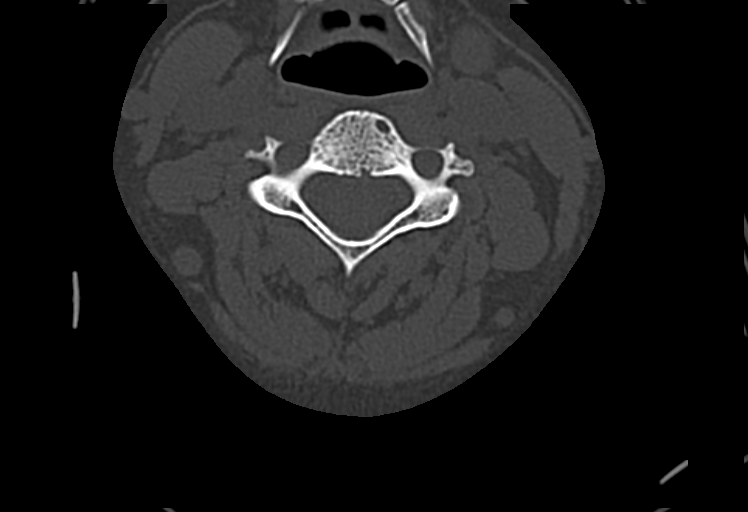
[im 62/97  brain]
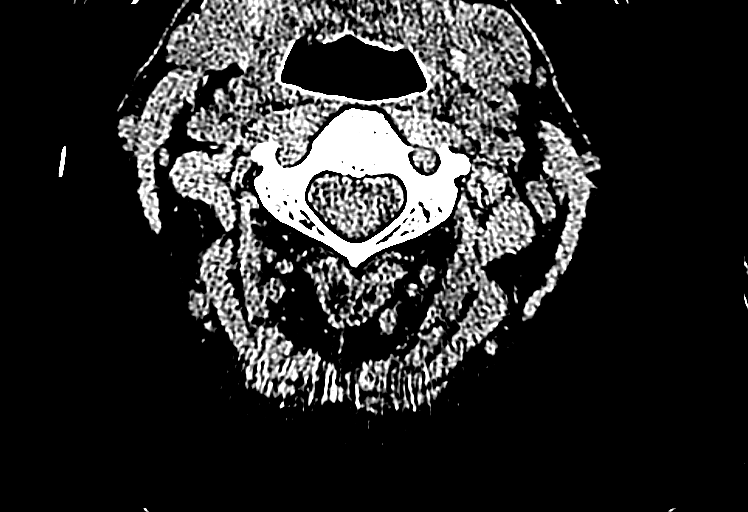
[im 79/97  brain]
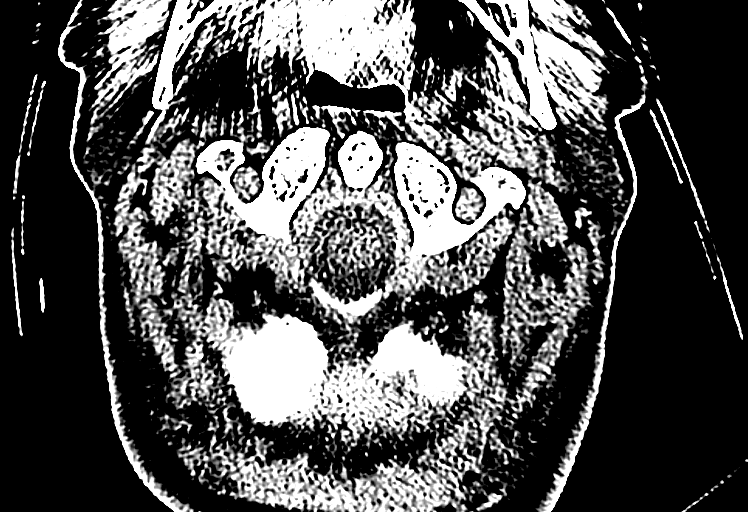
[im 88/97  brain]
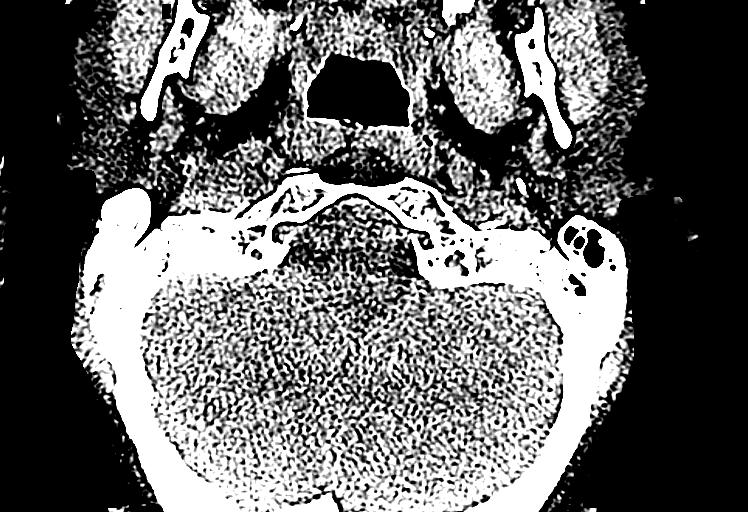

[11 of 47 positions shown; findings below may reference images not displayed]

FINDINGS: CT HEAD FINDINGS

The brain demonstrates no evidence of hemorrhage, infarction, edema,
mass effect, extra-axial fluid collection, hydrocephalus or mass
lesion. The skull is unremarkable.

CT CERVICAL SPINE FINDINGS

The cervical spine shows normal alignment. There is no evidence of
acute fracture or subluxation. No soft tissue swelling or hematoma
is identified. Mild cervical spondylosis at C6-7. No bony or soft
tissue lesions are seen. The visualized airway is normally patent.
IMPRESSION: No acute findings by head CT or cervical CT. Mild cervical
spondylosis at C6-7.

## 2016-12-02 ENCOUNTER — Ambulatory Visit (INDEPENDENT_AMBULATORY_CARE_PROVIDER_SITE_OTHER): Payer: BLUE CROSS/BLUE SHIELD | Admitting: Neurology

## 2016-12-02 ENCOUNTER — Encounter: Payer: Self-pay | Admitting: Neurology

## 2016-12-02 VITALS — BP 129/87 | HR 76 | Ht 66.0 in | Wt 182.0 lb

## 2016-12-02 DIAGNOSIS — R351 Nocturia: Secondary | ICD-10-CM

## 2016-12-02 DIAGNOSIS — G4719 Other hypersomnia: Secondary | ICD-10-CM

## 2016-12-02 DIAGNOSIS — R0683 Snoring: Secondary | ICD-10-CM

## 2016-12-02 DIAGNOSIS — G478 Other sleep disorders: Secondary | ICD-10-CM | POA: Diagnosis not present

## 2016-12-02 DIAGNOSIS — F319 Bipolar disorder, unspecified: Secondary | ICD-10-CM

## 2016-12-02 DIAGNOSIS — E663 Overweight: Secondary | ICD-10-CM | POA: Diagnosis not present

## 2016-12-02 NOTE — Progress Notes (Signed)
Subjective:    Patient ID: Janet Lewis is a 46 y.o. female.  HPI     Star Age, MD, PhD [facility] [address], [address] P.O. [place], [place] [address]  Dear Magda Paganini,   I saw your patient, Janet Lewis, upon your kind request in my neurologic clinic today for initial consultation of her sleep disorder, in particular, concern for underlying obstructive sleep apnea. The patient is unaccompanied today. As you know, Janet Lewis is a 46 year old right-handed woman with an underlying medical history of bipolar disease, overweight state, alcohol dependence with sobriety for the past 24 months (since 07/02/2014 per patient's report ), as well as hypothyroidism, who reports snoring and excessive daytime somnolence as well as recent weight gain, in the realm of 20 pounds in the past 4+ months. She had recent medication changes. Of note, she does take several potentially sedating medications. She is on lithium long-acting 300 mg twice a day, Lamictal 150 mg twice daily, baclofen 10 mg twice daily, naltrexone once a day, multivitamin, Synthroid, trazodone 150 mg at bedtime. She works as a Firefighter. She works from 7:30 AM to 3:30 PM 5 days a week. She has been told by her family that she snores. I reviewed your office note from 10/08/2016, which you kindly included. Her Epworth sleepiness score is 13 out of 24 and fatigue score is 41 out of 63. She states that she sometimes takes a nap during her lunch hour. She has no family history of sleep apnea. She has a tendency to rock herself to sleep at times, she has done this for years. One of her children has body rocking. She has a history of sleep talking but no history of sleepwalking. She has nocturia about 3 times per average night and denies morning headaches or telltale restless leg symptoms. She tries to go to bed between 10 and 11, takes her trazodone at 10 PM. Wakeup time is 5:15 AM, at the most 6 AM. She  is a nonsmoker. She does not drink caffeine as she felt she was addicted to caffeine and she came off of it. She rarely drinks something with caffeine when she is at a restaurant. Her biggest complaint is daytime somnolence as well as waking up multiple times during the night, sometimes she also takes Tylenol PM. She feels exhausted during the day. Her husband has reported sleep talking to her.  Her Past Medical History Is Significant For: Past Medical History:  Diagnosis Date  . Anxiety   . Bipolar disorder ([place])     Her Past Surgical History Is Significant For: Past Surgical History:  Procedure Laterality Date  . AUGMENTATION MAMMAPLASTY Bilateral 2008  . REFRACTIVE SURGERY Bilateral ~ 2008    Her Family History Is Significant For: Family History  Problem Relation Age of Onset  . Heart failure Mother   . Hypertension Mother   . Hypertension Father     Her Social History Is Significant For: Social History   Social History  . Marital status: Married    Spouse name: N/A  . Number of children: N/A  . Years of education: N/A   Social History Main Topics  . Smoking status: Former Smoker    Packs/day: 1.00    Years: 5.00    Types: Cigarettes    Quit date: 12/08/1992  . Smokeless tobacco: Never Used  . Alcohol use 28.8 oz/week    48 Shots of liquor per week  . Drug use: No  .  Sexual activity: Yes     Comment: husband had vasectomy   Other Topics Concern  . None   Social History Narrative  . None    Her Allergies Are:  Allergies  Allergen Reactions  . Codeine Itching    Pt reports allergic reaction of itching to "all pain medications"  . Sulfa Antibiotics Hives  . Neosporin [Neomycin-Bacitracin Zn-Polymyx] Rash  :   Her Current Medications Are:  Outpatient Encounter Prescriptions as of 12/02/2016  Medication Sig  . baclofen (LIORESAL) 10 MG tablet Take 10 mg by mouth 2 (two) times daily.  Marland Kitchen lamoTRIgine (LAMICTAL) 150 MG tablet Take 150 mg by mouth 2 (two) times  daily.  Marland Kitchen levothyroxine (SYNTHROID, LEVOTHROID) 75 MCG tablet Take 75 mcg by mouth daily before breakfast.  . lithium carbonate 300 MG capsule Take 1 capsule (300 mg total) by mouth 3 (three) times daily with meals. For mood stabilization (Patient taking differently: Take 300 mg by mouth 2 (two) times daily with a meal. For mood stabilization)  . Multiple Vitamin (MULTIVITAMIN) tablet Take 1 tablet by mouth daily.  . naltrexone (DEPADE) 50 MG tablet Take 50 mg by mouth daily.  . traZODone (DESYREL) 150 MG tablet Take 150 mg by mouth at bedtime.   No facility-administered encounter medications on file as of 12/02/2016.   :  Review of Systems:  Out of a complete 14 point review of systems, all are reviewed and negative with the exception of these symptoms as listed below: Review of Systems  Neurological:       Pt presents today to discuss her sleep. Pt has never had a sleep study but does endorse snoring. Pt has trouble going to sleep.  Epworth Sleepiness Scale 0= would never doze 1= slight chance of dozing 2= moderate chance of dozing 3= high chance of dozing  Sitting and reading: 1 Watching TV: 1 Sitting inactive in a public place (ex. Theater or meeting): 2 As a passenger in a car for an hour without a break: 3 Lying down to rest in the afternoon: 3 Sitting and talking to someone: 0 Sitting quietly after lunch (no alcohol): 1 In a car, while stopped in traffic: 2 Total: 13     Objective:  Neurological Exam  Physical Exam Physical Examination:   Vitals:   12/02/16 1710  BP: 129/87  Pulse: 76   General Examination: The patient is a very pleasant 46 y.o. female in no acute distress. She appears well-developed and well-nourished and well groomed.   HEENT: Normocephalic, atraumatic, pupils are equal, round and reactive to light and accommodation. Funduscopic exam is normal with sharp disc margins noted. Extraocular tracking is good without limitation to gaze excursion or  nystagmus noted. Normal smooth pursuit is noted. Hearing is grossly intact. Face is symmetric with normal facial animation and normal facial sensation. Speech is clear with no dysarthria noted. There is no hypophonia. There is no lip, neck/head, jaw or voice tremor. Neck is supple with full range of passive and active motion. There are no carotid bruits on auscultation. Oropharynx exam reveals: mild mouth dryness, good dental hygiene and mild airway crowding, due to Smaller airway entry, slightly more prominent uvula, tonsils are quite small. Mallampati is class I. Neck circumference is 13-3/8 inches. She has a very mild overbite. Tongue protrudes centrally and palate elevates symmetrically.   Chest: Clear to auscultation without wheezing, rhonchi or crackles noted.  Heart: S1+S2+0, regular and normal without murmurs, rubs or gallops noted.   Abdomen: Soft, non-tender  and non-distended with normal bowel sounds appreciated on auscultation.  Extremities: There is no pitting edema in the distal lower extremities bilaterally. Swelling around left ankle.   Skin: Warm and dry without trophic changes noted.  Musculoskeletal: exam reveals no obvious joint deformities, tenderness or joint swelling or erythema.   Neurologically:  Mental status: The patient is awake, alert and oriented in all 4 spheres. Her immediate and remote memory, attention, language skills and fund of knowledge are appropriate. There is no evidence of aphasia, agnosia, apraxia or anomia. Speech is clear with normal prosody and enunciation. Thought process is linear. Mood is normal and affect is normal.  Cranial nerves II - XII are as described above under HEENT exam. In addition: shoulder shrug is normal with equal shoulder height noted. Motor exam: Normal bulk, strength and tone is noted. There is no drift, tremor or rebound. Romberg is negative. Reflexes are 2+ throughout. Fine motor skills and coordination: intact with normal finger  taps, normal hand movements, normal rapid alternating patting, normal foot taps and normal foot agility.  Cerebellar testing: No dysmetria or intention tremor on finger to nose testing. Heel to shin is unremarkable bilaterally. There is no truncal or gait ataxia.  Sensory exam: intact to light touch in the upper and lower extremities.  Gait, station and balance: She stands easily. No veering to one side is noted. No leaning to one side is noted. Posture is age-appropriate and stance is narrow based. Gait shows normal stride length and normal pace. No problems turning are noted. Tandem walk is unremarkable.  Assessment and Plan:   In summary, Marrissa Dai is a very pleasant 46 y.o.-year old female with an underlying medical history of bipolar disease, overweight state, alcohol dependence with sobriety for the past 24 months (since 07/02/2014 per patient's report ), as well as hypothyroidism, whose history and physical exam are concerning for obstructive sleep apnea (OSA). I had a long chat with the patient about my findings and the diagnosis of OSA, its prognosis and treatment options. We talked about medical treatments, surgical interventions and non-pharmacological approaches. I explained in particular the risks and ramifications of untreated moderate to severe OSA, especially with respect to developing cardiovascular disease down the Road, including congestive heart failure, difficult to treat hypertension, cardiac arrhythmias, or stroke. Even type 2 diabetes has, in part, been linked to untreated OSA. Symptoms of untreated OSA include daytime sleepiness, memory problems, mood irritability and mood disorder such as depression and anxiety, lack of energy, as well as recurrent headaches, especially morning headaches. We talked about trying to maintain a healthy lifestyle in general, as well as the importance of weight control. I encouraged the patient to eat healthy, exercise daily and keep well hydrated,  to keep a scheduled bedtime and wake time routine, to not skip any meals and eat healthy snacks in between meals. I advised the patient not to drive when feeling sleepy.Unfortunately, her daytime somnolence may very well be in part related to taking several psychotropic medications.  I recommended the following at this time: sleep study with potential positive airway pressure titration. (We will score hypopneas at 3%).   I explained the sleep test procedure to the patient and also outlined possible surgical and non-surgical treatment options of OSA, including the use of a custom-made dental device (which would require a referral to a specialist dentist or oral surgeon), upper airway surgical options, such as pillar implants, radiofrequency surgery, tongue base surgery, and UPPP (which would involve a referral to  an ENT surgeon). Rarely, jaw surgery such as mandibular advancement may be considered.  I also explained the CPAP treatment option to the patient, who indicated that she would be willing to try CPAP if the need arises. I explained the importance of being compliant with PAP treatment, not only for insurance purposes but primarily to improve Her symptoms, and for the patient's long term health benefit, including to reduce Her cardiovascular risks. I answered all her questions today and the patient was in agreement. I would like to see her back after the sleep study is completed and encouraged her to call with any interim questions, concerns, problems or updates.   Thank you very much for allowing me to participate in the care of this nice patient. If I can be of any further assistance to you please do not hesitate to call me at [phone].  Sincerely,   Star Age, MD, PhD

## 2017-05-19 DIAGNOSIS — F902 Attention-deficit hyperactivity disorder, combined type: Secondary | ICD-10-CM | POA: Diagnosis not present

## 2017-05-19 DIAGNOSIS — F419 Anxiety disorder, unspecified: Secondary | ICD-10-CM | POA: Diagnosis not present

## 2017-05-19 DIAGNOSIS — F3177 Bipolar disorder, in partial remission, most recent episode mixed: Secondary | ICD-10-CM | POA: Diagnosis not present

## 2017-05-19 DIAGNOSIS — Z79899 Other long term (current) drug therapy: Secondary | ICD-10-CM | POA: Diagnosis not present

## 2017-07-06 DIAGNOSIS — H04123 Dry eye syndrome of bilateral lacrimal glands: Secondary | ICD-10-CM | POA: Diagnosis not present

## 2017-07-06 DIAGNOSIS — H0100B Unspecified blepharitis left eye, upper and lower eyelids: Secondary | ICD-10-CM | POA: Diagnosis not present

## 2017-07-06 DIAGNOSIS — H0100A Unspecified blepharitis right eye, upper and lower eyelids: Secondary | ICD-10-CM | POA: Diagnosis not present

## 2017-07-06 DIAGNOSIS — H5310 Unspecified subjective visual disturbances: Secondary | ICD-10-CM | POA: Diagnosis not present

## 2017-07-25 DIAGNOSIS — F419 Anxiety disorder, unspecified: Secondary | ICD-10-CM | POA: Diagnosis not present

## 2017-08-01 DIAGNOSIS — F419 Anxiety disorder, unspecified: Secondary | ICD-10-CM | POA: Diagnosis not present

## 2017-08-08 DIAGNOSIS — F419 Anxiety disorder, unspecified: Secondary | ICD-10-CM | POA: Diagnosis not present

## 2017-08-09 DIAGNOSIS — D519 Vitamin B12 deficiency anemia, unspecified: Secondary | ICD-10-CM | POA: Diagnosis not present

## 2017-08-09 DIAGNOSIS — Z5181 Encounter for therapeutic drug level monitoring: Secondary | ICD-10-CM | POA: Diagnosis not present

## 2017-08-09 DIAGNOSIS — E559 Vitamin D deficiency, unspecified: Secondary | ICD-10-CM | POA: Diagnosis not present

## 2017-08-12 DIAGNOSIS — F419 Anxiety disorder, unspecified: Secondary | ICD-10-CM | POA: Diagnosis not present

## 2017-08-12 DIAGNOSIS — F902 Attention-deficit hyperactivity disorder, combined type: Secondary | ICD-10-CM | POA: Diagnosis not present

## 2017-08-12 DIAGNOSIS — Z79899 Other long term (current) drug therapy: Secondary | ICD-10-CM | POA: Diagnosis not present

## 2017-08-12 DIAGNOSIS — F3177 Bipolar disorder, in partial remission, most recent episode mixed: Secondary | ICD-10-CM | POA: Diagnosis not present

## 2017-08-22 DIAGNOSIS — F419 Anxiety disorder, unspecified: Secondary | ICD-10-CM | POA: Diagnosis not present

## 2017-08-24 DIAGNOSIS — F1020 Alcohol dependence, uncomplicated: Secondary | ICD-10-CM | POA: Diagnosis not present

## 2017-08-24 DIAGNOSIS — F3181 Bipolar II disorder: Secondary | ICD-10-CM | POA: Diagnosis not present

## 2017-09-05 DIAGNOSIS — F419 Anxiety disorder, unspecified: Secondary | ICD-10-CM | POA: Diagnosis not present

## 2017-11-03 DIAGNOSIS — Z79899 Other long term (current) drug therapy: Secondary | ICD-10-CM | POA: Diagnosis not present

## 2017-11-03 DIAGNOSIS — F419 Anxiety disorder, unspecified: Secondary | ICD-10-CM | POA: Diagnosis not present

## 2017-11-03 DIAGNOSIS — F3177 Bipolar disorder, in partial remission, most recent episode mixed: Secondary | ICD-10-CM | POA: Diagnosis not present

## 2017-11-03 DIAGNOSIS — F902 Attention-deficit hyperactivity disorder, combined type: Secondary | ICD-10-CM | POA: Diagnosis not present

## 2018-01-16 DIAGNOSIS — H6691 Otitis media, unspecified, right ear: Secondary | ICD-10-CM | POA: Diagnosis not present

## 2018-01-19 DIAGNOSIS — J069 Acute upper respiratory infection, unspecified: Secondary | ICD-10-CM | POA: Diagnosis not present

## 2018-01-21 DIAGNOSIS — H8301 Labyrinthitis, right ear: Secondary | ICD-10-CM | POA: Diagnosis not present

## 2018-01-27 DIAGNOSIS — F3181 Bipolar II disorder: Secondary | ICD-10-CM | POA: Diagnosis not present

## 2018-01-28 DIAGNOSIS — F3177 Bipolar disorder, in partial remission, most recent episode mixed: Secondary | ICD-10-CM | POA: Diagnosis not present

## 2018-01-28 DIAGNOSIS — Z79899 Other long term (current) drug therapy: Secondary | ICD-10-CM | POA: Diagnosis not present

## 2018-01-28 DIAGNOSIS — F419 Anxiety disorder, unspecified: Secondary | ICD-10-CM | POA: Diagnosis not present

## 2018-01-28 DIAGNOSIS — F902 Attention-deficit hyperactivity disorder, combined type: Secondary | ICD-10-CM | POA: Diagnosis not present

## 2018-07-27 DIAGNOSIS — F3181 Bipolar II disorder: Secondary | ICD-10-CM | POA: Diagnosis not present

## 2018-08-04 DIAGNOSIS — E559 Vitamin D deficiency, unspecified: Secondary | ICD-10-CM | POA: Diagnosis not present

## 2018-08-04 DIAGNOSIS — Z5181 Encounter for therapeutic drug level monitoring: Secondary | ICD-10-CM | POA: Diagnosis not present

## 2018-08-23 DIAGNOSIS — F3181 Bipolar II disorder: Secondary | ICD-10-CM | POA: Diagnosis not present

## 2018-08-23 DIAGNOSIS — F1020 Alcohol dependence, uncomplicated: Secondary | ICD-10-CM | POA: Diagnosis not present

## 2018-08-25 DIAGNOSIS — F902 Attention-deficit hyperactivity disorder, combined type: Secondary | ICD-10-CM | POA: Diagnosis not present

## 2018-08-25 DIAGNOSIS — Z79899 Other long term (current) drug therapy: Secondary | ICD-10-CM | POA: Diagnosis not present

## 2018-08-25 DIAGNOSIS — F3177 Bipolar disorder, in partial remission, most recent episode mixed: Secondary | ICD-10-CM | POA: Diagnosis not present

## 2018-08-25 DIAGNOSIS — F419 Anxiety disorder, unspecified: Secondary | ICD-10-CM | POA: Diagnosis not present

## 2018-09-21 DIAGNOSIS — F3181 Bipolar II disorder: Secondary | ICD-10-CM | POA: Diagnosis not present

## 2018-11-18 DIAGNOSIS — B342 Coronavirus infection, unspecified: Secondary | ICD-10-CM | POA: Diagnosis not present

## 2018-12-05 DIAGNOSIS — F3177 Bipolar disorder, in partial remission, most recent episode mixed: Secondary | ICD-10-CM | POA: Diagnosis not present

## 2018-12-05 DIAGNOSIS — Z79899 Other long term (current) drug therapy: Secondary | ICD-10-CM | POA: Diagnosis not present

## 2018-12-05 DIAGNOSIS — F902 Attention-deficit hyperactivity disorder, combined type: Secondary | ICD-10-CM | POA: Diagnosis not present

## 2018-12-05 DIAGNOSIS — F419 Anxiety disorder, unspecified: Secondary | ICD-10-CM | POA: Diagnosis not present

## 2019-01-17 DIAGNOSIS — F3181 Bipolar II disorder: Secondary | ICD-10-CM | POA: Diagnosis not present

## 2019-02-15 DIAGNOSIS — F1020 Alcohol dependence, uncomplicated: Secondary | ICD-10-CM | POA: Diagnosis not present

## 2019-02-15 DIAGNOSIS — F3181 Bipolar II disorder: Secondary | ICD-10-CM | POA: Diagnosis not present

## 2019-03-01 DIAGNOSIS — F1020 Alcohol dependence, uncomplicated: Secondary | ICD-10-CM | POA: Diagnosis not present

## 2019-03-01 DIAGNOSIS — F3181 Bipolar II disorder: Secondary | ICD-10-CM | POA: Diagnosis not present

## 2019-03-03 DIAGNOSIS — S3992XA Unspecified injury of lower back, initial encounter: Secondary | ICD-10-CM | POA: Diagnosis not present

## 2019-03-03 DIAGNOSIS — R519 Headache, unspecified: Secondary | ICD-10-CM | POA: Diagnosis not present

## 2019-03-03 DIAGNOSIS — M545 Low back pain: Secondary | ICD-10-CM | POA: Diagnosis not present

## 2019-03-03 DIAGNOSIS — S0990XA Unspecified injury of head, initial encounter: Secondary | ICD-10-CM | POA: Diagnosis not present

## 2019-03-03 DIAGNOSIS — S199XXA Unspecified injury of neck, initial encounter: Secondary | ICD-10-CM | POA: Diagnosis not present

## 2019-03-03 DIAGNOSIS — S299XXA Unspecified injury of thorax, initial encounter: Secondary | ICD-10-CM | POA: Diagnosis not present

## 2019-03-08 DIAGNOSIS — Z5181 Encounter for therapeutic drug level monitoring: Secondary | ICD-10-CM | POA: Diagnosis not present

## 2019-03-14 DIAGNOSIS — M4722 Other spondylosis with radiculopathy, cervical region: Secondary | ICD-10-CM | POA: Diagnosis not present

## 2019-03-14 DIAGNOSIS — M5030 Other cervical disc degeneration, unspecified cervical region: Secondary | ICD-10-CM | POA: Diagnosis not present

## 2019-03-14 DIAGNOSIS — M542 Cervicalgia: Secondary | ICD-10-CM | POA: Diagnosis not present

## 2019-03-21 DIAGNOSIS — M542 Cervicalgia: Secondary | ICD-10-CM | POA: Diagnosis not present

## 2019-03-21 DIAGNOSIS — M4722 Other spondylosis with radiculopathy, cervical region: Secondary | ICD-10-CM | POA: Diagnosis not present

## 2019-03-28 DIAGNOSIS — M542 Cervicalgia: Secondary | ICD-10-CM | POA: Diagnosis not present

## 2019-03-28 DIAGNOSIS — M4722 Other spondylosis with radiculopathy, cervical region: Secondary | ICD-10-CM | POA: Diagnosis not present

## 2019-03-28 DIAGNOSIS — M50222 Other cervical disc displacement at C5-C6 level: Secondary | ICD-10-CM | POA: Diagnosis not present

## 2019-03-29 DIAGNOSIS — F3181 Bipolar II disorder: Secondary | ICD-10-CM | POA: Diagnosis not present

## 2019-03-31 DIAGNOSIS — Z1159 Encounter for screening for other viral diseases: Secondary | ICD-10-CM | POA: Diagnosis not present

## 2019-04-05 DIAGNOSIS — M50122 Cervical disc disorder at C5-C6 level with radiculopathy: Secondary | ICD-10-CM | POA: Diagnosis not present

## 2019-04-05 DIAGNOSIS — M4722 Other spondylosis with radiculopathy, cervical region: Secondary | ICD-10-CM | POA: Diagnosis not present

## 2019-04-25 DIAGNOSIS — M542 Cervicalgia: Secondary | ICD-10-CM | POA: Diagnosis not present

## 2019-09-22 ENCOUNTER — Other Ambulatory Visit: Payer: Self-pay | Admitting: Adult Health

## 2020-04-30 LAB — COLOGUARD

## 2020-05-20 LAB — COLOGUARD: COLOGUARD: NEGATIVE

## 2020-11-08 ENCOUNTER — Other Ambulatory Visit: Payer: Self-pay | Admitting: Neurosurgery

## 2020-12-05 ENCOUNTER — Inpatient Hospital Stay: Admit: 2020-12-05 | Payer: BLUE CROSS/BLUE SHIELD | Admitting: Neurosurgery

## 2020-12-05 SURGERY — POSTERIOR CERVICAL FUSION/FORAMINOTOMY LEVEL 1
Anesthesia: General

## 2023-04-22 ENCOUNTER — Other Ambulatory Visit (HOSPITAL_BASED_OUTPATIENT_CLINIC_OR_DEPARTMENT_OTHER): Payer: Self-pay

## 2023-04-22 ENCOUNTER — Ambulatory Visit (HOSPITAL_BASED_OUTPATIENT_CLINIC_OR_DEPARTMENT_OTHER)
Admission: EM | Admit: 2023-04-22 | Discharge: 2023-04-22 | Disposition: A | Discharge status: Home / Self Care | Payer: BC Managed Care – PPO | Attending: Internal Medicine | Admitting: Internal Medicine

## 2023-04-22 ENCOUNTER — Encounter (HOSPITAL_BASED_OUTPATIENT_CLINIC_OR_DEPARTMENT_OTHER): Payer: Self-pay

## 2023-04-22 DIAGNOSIS — B370 Candidal stomatitis: Secondary | ICD-10-CM | POA: Diagnosis present

## 2023-04-22 DIAGNOSIS — J028 Acute pharyngitis due to other specified organisms: Secondary | ICD-10-CM | POA: Diagnosis not present

## 2023-04-22 DIAGNOSIS — L4050 Arthropathic psoriasis, unspecified: Secondary | ICD-10-CM | POA: Diagnosis not present

## 2023-04-22 DIAGNOSIS — Z79899 Other long term (current) drug therapy: Secondary | ICD-10-CM

## 2023-04-22 DIAGNOSIS — B9789 Other viral agents as the cause of diseases classified elsewhere: Secondary | ICD-10-CM | POA: Diagnosis not present

## 2023-04-22 DIAGNOSIS — D84821 Immunodeficiency due to drugs: Secondary | ICD-10-CM

## 2023-04-22 DIAGNOSIS — J029 Acute pharyngitis, unspecified: Secondary | ICD-10-CM

## 2023-04-22 LAB — POCT RAPID STREP A (OFFICE): Rapid Strep A Screen: NEGATIVE

## 2023-04-22 MED ORDER — NYSTATIN 100000 UNIT/ML MT SUSP: 500000.0000 [IU] | Freq: Four times a day (QID) | OROMUCOSAL | 0 refills | Status: DC

## 2023-04-22 MED ORDER — NYSTATIN 100000 UNIT/ML MT SUSP
500000.0000 [IU] | Freq: Four times a day (QID) | OROMUCOSAL | 0 refills | Status: AC
  Filled 2023-04-22: qty 473, 24d supply, fill #0

## 2023-04-22 NOTE — ED Provider Notes (Addendum)
[facility]    CSN: [id number] Arrival date & time: 04/22/23  5638      History   Chief Complaint Chief Complaint  Patient presents with   Sore Throat    HPI Janet Lewis is a 52 y.o. female.   Patient presents to urgent care for evaluation of sore throat that started 3 days ago on Monday, April 19, 2023.  Sore throat is worsened by swallowing and currently an 8 on a scale of 0-10.  History of psoriatic arthritis, started on a new medication (Bimzelx) 2 weeks ago and started noticing white patches to her tongue yesterday that appear to be thrush infection.  Denies other changes to medications, recent antibiotic/steroid use, inhaler use, and history of diabetes.  No recent sick contacts with similar symptoms.  Denies cough, nasal congestion, rash, fevers, chills, N/B/D, and abdominal pain.  Taking ibuprofen with some relief OTC.    Sore Throat    Past Medical History:  Diagnosis Date   Anxiety    Bipolar disorder [facility])     Patient Active Problem List   Diagnosis Date Noted   Alcohol dependence with uncomplicated intoxication (HCC)    Bipolar 1 disorder, depressed, severe (HCC) 06/24/2014   Alcohol abuse    Tegretol toxicity 06/20/2014   Drug overdose 06/20/2014   Bipolar disorder (HCC) 06/20/2014   Drug overdose, intentional (HCC) 06/20/2014   Alcohol dependence (HCC) 06/13/2012   Episodic mood disorder (HCC) 06/13/2012    Past Surgical History:  Procedure Laterality Date   AUGMENTATION MAMMAPLASTY Bilateral 2008   REFRACTIVE SURGERY Bilateral ~ 2008    OB History   No obstetric history on file.      Home Medications    Prior to Admission medications   Medication Sig Start Date End Date Taking? Authorizing Provider  baclofen (LIORESAL) 10 MG tablet Take 10 mg by mouth 2 (two) times daily.    [provider]  lamoTRIgine (LAMICTAL) 150 MG tablet Take 150 mg by mouth 2 (two) times daily.    [provider]  levothyroxine  (SYNTHROID, LEVOTHROID) 75 MCG tablet Take 75 mcg by mouth daily before breakfast.    [provider]  lithium carbonate 300 MG capsule Take 1 capsule (300 mg total) by mouth 3 (three) times daily with meals. For mood stabilization Patient taking differently: Take 300 mg by mouth 2 (two) times daily with a meal. For mood stabilization 06/27/14   Armandina Stammer I, NP  Multiple Vitamin (MULTIVITAMIN) tablet Take 1 tablet by mouth daily.    [provider]  naltrexone (DEPADE) 50 MG tablet Take 50 mg by mouth daily.    [provider]  nystatin (MYCOSTATIN) 100000 UNIT/ML suspension Take 5 mLs (500,000 Units total) by mouth 4 (four) times daily. 04/22/23   Carlisle Beers, FNP  traZODone (DESYREL) 150 MG tablet Take 150 mg by mouth at bedtime.    [provider]    Family History Family History  Problem Relation Age of Onset   Heart failure Mother    Hypertension Mother    Hypertension Father     Social History Social History   Tobacco Use   Smoking status: Former    Current packs/day: 0.00    Average packs/day: 1 pack/day for 5.0 years (5.0 ttl pk-yrs)    Types: Cigarettes    Start date: 12/09/1987    Quit date: 12/08/1992    Years since quitting: 30.3   Smokeless tobacco: Never  Substance Use Topics  Alcohol use: Yes    Alcohol/week: 48.0 standard drinks of alcohol    Types: 48 Shots of liquor per week   Drug use: No     Allergies   Codeine, Sulfa antibiotics, and Neosporin [neomycin-bacitracin zn-polymyx]   Review of Systems Review of Systems Per HPI  Physical Exam Triage Vital Signs ED Triage Vitals  Encounter Vitals Group     BP 04/22/23 1020 (!) 142/85     Systolic BP Percentile --      Diastolic BP Percentile --      Pulse Rate 04/22/23 1020 95     Resp 04/22/23 1020 20     Temp 04/22/23 1020 98.3 F (36.8 C)     Temp Source 04/22/23 1020 Oral     SpO2 04/22/23 1020 96 %     Weight --      Height --      Head  Circumference --      Peak Flow --      Pain Score 04/22/23 1021 8     Pain Loc --      Pain Education --      Exclude from Growth Chart --    No data found.  Updated Vital Signs BP (!) 142/85 (BP Location: Right Arm)   Pulse 95   Temp 98.3 F (36.8 C) (Oral)   Resp 20   SpO2 96%   Visual Acuity Right Eye Distance:   Left Eye Distance:   Bilateral Distance:    Right Eye Near:   Left Eye Near:    Bilateral Near:     Physical Exam Vitals and nursing note reviewed.  Constitutional:      Appearance: She is not ill-appearing or toxic-appearing.  HENT:     Head: Normocephalic and atraumatic.     Jaw: There is normal jaw occlusion.     Right Ear: Hearing, tympanic membrane, ear canal and external ear normal.     Left Ear: Hearing, tympanic membrane, ear canal and external ear normal.     Nose: Nose normal.     Mouth/Throat:     Lips: Pink.     Mouth: Mucous membranes are moist. No injury.     Tongue: Lesions (patchy white lesions to tongue) present. Tongue does not deviate from midline.     Palate: No mass and lesions.     Pharynx: Uvula midline. Pharyngeal swelling and posterior oropharyngeal erythema present. No oropharyngeal exudate or uvula swelling.     Tonsils: Tonsillar exudate present. No tonsillar abscesses. 1+ on the right. 1+ on the left.     Comments: No trismus, phonation normal, maintaining secretions without difficulty.   Eyes:     General: Lids are normal. Vision grossly intact. Gaze aligned appropriately.     Extraocular Movements: Extraocular movements intact.     Conjunctiva/sclera: Conjunctivae normal.  Cardiovascular:     Rate and Rhythm: Normal rate and regular rhythm.     Heart sounds: Normal heart sounds, S1 normal and S2 normal.  Pulmonary:     Effort: Pulmonary effort is normal. No respiratory distress.     Breath sounds: Normal breath sounds and air entry.  Musculoskeletal:     Cervical back: Neck supple.  Skin:    General: Skin is warm and  dry.     Capillary Refill: Capillary refill takes less than 2 seconds.     Findings: No rash.  Neurological:     General: No focal deficit present.     Mental Status:  She is alert and oriented to person, place, and time. Mental status is at baseline.     Cranial Nerves: No dysarthria or facial asymmetry.  Psychiatric:        Mood and Affect: Mood normal.        Speech: Speech normal.        Behavior: Behavior normal.        Thought Content: Thought content normal.        Judgment: Judgment normal.      UC Treatments / Results  Labs (all labs ordered are listed, but only abnormal results are displayed) Labs Reviewed  POCT RAPID STREP A (OFFICE) - Normal  CULTURE, GROUP A STREP [facility])    EKG   Radiology No results found.  Procedures Procedures (including critical care time)  Medications Ordered in UC Medications - No data to display  Initial Impression / Assessment and Plan / UC Course  I have reviewed the triage vital signs and the nursing notes.  Pertinent labs & imaging results that were available during my care of the patient were reviewed by me and considered in my medical decision making (see chart for details).   1. Viral pharyngitis, oral thrush Evaluation suggests viral pharyngitis etiology with likely oral thrush as well secondary to biologic for psoriatic arthritis. Nystatin swish and swallow every 6 hours for the next 7-10 days.   Group A strep POC testing negative, throat culture pending, will treat based on throat culture results for bacterial pharyngitis if indicated. Low suspicion for mononucleosis, epiglottitis, peritonsillar abscess, etc.  HEENT exam stable without red flag signs. Will manage this conservatively with supportive care. Tylenol/ibuprofen as needed for pain and inflammation.  Counseled patient on potential for adverse effects with medications prescribed/recommended today, strict ER and return-to-clinic precautions discussed, patient  verbalized understanding.    Final Clinical Impressions(s) / UC Diagnoses   Final diagnoses:  Viral pharyngitis  Oral thrush     Discharge Instructions      Strep test in the clinic is negative, staff will call you if the throat culture is positive for bacteria in the next 2-3 days and call in treatment if necessary based on result.   For now, we will treat this as a viral infection with the following: - Over the counter medicines as needed for pain and swelling of the throat (Ibuprofen 600mg  and/or Tylenol 1,000mg  every 6 hours as needed) - 1 tablespoon of honey in warm water and/or salt water gargles every 3-4 hours  Seek medical care if you develop changes to your voice, inability to swallow, drooling, or any new symptoms. If symptoms are severe, please go to the ER. I hope you feel better!       ED Prescriptions     Medication Sig Dispense Auth. Provider   nystatin (MYCOSTATIN) 100000 UNIT/ML suspension  (Status: Discontinued) Take 5 mLs (500,000 Units total) by mouth 4 (four) times daily. 473 mL Reita May M, FNP   nystatin (MYCOSTATIN) 100000 UNIT/ML suspension Take 5 mLs (500,000 Units total) by mouth 4 (four) times daily. 473 mL Carlisle Beers, FNP      PDMP not reviewed this encounter.   Carlisle Beers, FNP 04/22/23 1108    Carlisle Beers, FNP 04/22/23 1122

## 2023-04-22 NOTE — Discharge Instructions (Signed)
 Strep test in the clinic is negative, staff will call you if the throat culture is positive for bacteria in the next 2-3 days and call in treatment if necessary based on result.   For now, we will treat this as a viral infection with the following: - Over the counter medicines as needed for pain and swelling of the throat (Ibuprofen 600mg  and/or Tylenol 1,000mg  every 6 hours as needed) - 1 tablespoon of honey in warm water and/or salt water gargles every 3-4 hours  Seek medical care if you develop changes to your voice, inability to swallow, drooling, or any new symptoms. If symptoms are severe, please go to the ER. I hope you feel better!

## 2023-04-22 NOTE — ED Triage Notes (Signed)
Onset of sore throat Monday. Noticed white on tongue yesterday with pain.  Appears to have thrush infection. No new meds. Patient has psoriatic arthritis and on new medication. Patient requesting no controlled substances.

## 2023-04-26 LAB — CULTURE, GROUP A STREP (THRC)

## 2023-06-04 ENCOUNTER — Other Ambulatory Visit (HOSPITAL_COMMUNITY): Payer: Self-pay

## 2023-10-06 ENCOUNTER — Ambulatory Visit (HOSPITAL_BASED_OUTPATIENT_CLINIC_OR_DEPARTMENT_OTHER): Payer: Self-pay

## 2024-02-07 LAB — COLOGUARD: COLOGUARD: POSITIVE — AB

## 2024-04-29 ENCOUNTER — Ambulatory Visit (HOSPITAL_BASED_OUTPATIENT_CLINIC_OR_DEPARTMENT_OTHER): Admission: EM | Admit: 2024-04-29 | Source: Home / Self Care

## 2024-04-30 ENCOUNTER — Encounter (HOSPITAL_BASED_OUTPATIENT_CLINIC_OR_DEPARTMENT_OTHER): Payer: Self-pay

## 2024-04-30 ENCOUNTER — Ambulatory Visit (HOSPITAL_BASED_OUTPATIENT_CLINIC_OR_DEPARTMENT_OTHER)
Admission: RE | Admit: 2024-04-30 | Discharge: 2024-04-30 | Disposition: A | Discharge status: Home / Self Care | Payer: Self-pay | Source: Ambulatory Visit

## 2024-04-30 VITALS — BP 116/79 | HR 80 | Temp 98.4°F | Resp 20

## 2024-04-30 DIAGNOSIS — R591 Generalized enlarged lymph nodes: Secondary | ICD-10-CM | POA: Diagnosis not present

## 2024-04-30 NOTE — ED Triage Notes (Signed)
 Patient reports approx 1 week ago, woke up and noticed a hard, round, raised area to neck toward back hairline. States not real painful unless it's pressed a lot. Denies recent sore throat or illness. Nodule is hard, difficult to move.

## 2024-04-30 NOTE — ED Provider Notes (Signed)
 " [facility]    CSN: [id number] Arrival date & time: 04/30/24  1255      History   Chief Complaint Chief Complaint  Patient presents with   Neck Pain    HPI Janet Lewis is a 54 y.o. female.   Pt is a 54 year old female that presents with lump to neck. Patient reports approx 1 week ago, woke up and noticed a hard, round, raised area to neck toward back hairline. States not real painful unless it's pressed a lot. Denies recent sore throat or illness. No fever. Over all feeing well.    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      Home Medications    Prior to Admission medications  Medication Sig Start Date End Date Taking? Authorizing Provider  QUEtiapine (SEROQUEL) 25 MG tablet Take 25 mg by mouth at bedtime. 10/05/21  Yes [provider]  baclofen (LIORESAL) 10 MG tablet Take 10 mg by mouth 2 (two) times daily.    [provider]  lamoTRIgine  (LAMICTAL ) 150 MG tablet Take 150 mg by mouth 2 (two) times daily.    [provider]  levothyroxine (SYNTHROID, LEVOTHROID) 75 MCG tablet Take 75 mcg by mouth daily before breakfast.    [provider]  lithium  carbonate 300 MG capsule Take 1 capsule (300 mg total) by mouth 3 (three) times daily with meals. For mood stabilization Patient taking  differently: Take 300 mg by mouth 2 (two) times daily with a meal. For mood stabilization 06/27/14   Collene Gouge I, NP  Multiple Vitamin (MULTIVITAMIN) tablet Take 1 tablet by mouth daily.    [provider]  naltrexone  (DEPADE) 50 MG tablet Take 50 mg by mouth daily.    [provider]  nystatin  (MYCOSTATIN ) 100000 UNIT/ML suspension Take 5 mLs (500,000 Units total) by mouth 4 (four) times daily. 04/22/23   Enedelia Dorna HERO, FNP  traZODone  (DESYREL ) 150 MG tablet Take 150 mg by mouth at bedtime.    [provider]    Family History Family History  Problem Relation Age of Onset   Heart failure Mother    Hypertension Mother    Hypertension Father     Social History Social History[1]   Allergies   Codeine, Sulfa antibiotics, and Neosporin [neomycin-bacitracin zn-polymyx]   Review of Systems Review of Systems  Musculoskeletal:  Positive for neck pain.     Physical Exam Triage Vital Signs ED Triage Vitals  Encounter Vitals Group     BP 04/30/24 1307 116/79     Girls Systolic BP Percentile --      Girls Diastolic BP Percentile --      Boys Systolic BP Percentile --      Boys Diastolic BP Percentile --      Pulse Rate 04/30/24 1307  80     Resp 04/30/24 1307 20     Temp 04/30/24 1307 98.4 F (36.9 C)     Temp Source 04/30/24 1307 Oral     SpO2 04/30/24 1307 98 %     Weight --      Height --      Head Circumference --      Peak Flow --      Pain Score 04/30/24 1309 2     Pain Loc --      Pain Education --      Exclude from Growth Chart --    No data found.  Updated Vital Signs BP 116/79 (BP Location: Right Arm)   Pulse 80   Temp 98.4 F (36.9 C) (Oral)   Resp 20   SpO2 98%   Visual Acuity Right Eye Distance:   Left Eye Distance:   Bilateral Distance:    Right Eye Near:   Left Eye Near:    Bilateral Near:     Physical Exam Vitals and nursing note reviewed.  Constitutional:      General: She is not in acute distress.     Appearance: Normal appearance. She is not ill-appearing, toxic-appearing or diaphoretic.  Pulmonary:     Effort: Pulmonary effort is normal.  Lymphadenopathy:     Head:     Right side of head: Occipital adenopathy present.     Comments: Hard immovable nodule to occipital area. Non tender.  Some dry skin patches near the area. Hx of psoriasis.   Skin:    General: Skin is warm and dry.  Neurological:     Mental Status: She is alert.  Psychiatric:        Mood and Affect: Mood normal.      UC Treatments / Results  Labs (all labs ordered are listed, but only abnormal results are displayed) Labs Reviewed - No data to display  EKG   Radiology No results found.  Procedures Procedures (including critical care time)  Medications Ordered in UC Medications - No data to display  Initial Impression / Assessment and Plan / UC Course  I have reviewed the triage vital signs and the nursing notes.  Pertinent labs & imaging results that were available during my care of the patient were reviewed by me and considered in my medical decision making (see chart for details).     Lymphadenopathy-believe that she has a hard immovable lymph node to the right occipital area pretty large in size.  It is nontender.  She does have psoriasis.  Otherwise she has been feeling okay.  If this is not lymph node this is some sort of mass that will need to be reevaluated.  Recommend to monitor the area.  She can do warm compresses to the area and take ibuprofen .  If this is not resolved or improving over the next couple weeks she will need to be rechecked.  If it is getting worse and having time or larger or more painful she will need to have it evaluated sooner.  Patient understanding and agrees Final Clinical Impressions(s) / UC Diagnoses   Final diagnoses:  Lymphadenopathy of head and neck     Discharge Instructions      I believe this is a lymph node. May be inflamed from psoriasis or other cause.  You can try warm compresses to the area. Ibuprofen  for pain and swelling.  If persists past 2 weeks see your PCP     ED Prescriptions  None    I have reviewed the PDMP during this encounter.     [1]  Social History Tobacco Use   Smoking status: Former    Current packs/day: 0.00    Average packs/day: 1 pack/day for 5.0 years (5.0 ttl pk-yrs)    Types: Cigarettes    Start date: 12/09/1987    Quit date: 12/08/1992    Years since quitting: 31.4   Smokeless tobacco: Never  Vaping Use   Vaping status: Never Used  Substance Use Topics   Alcohol  use: Yes    Alcohol /week: 48.0 standard drinks of alcohol     Types: 48 Shots of liquor per week   Drug use: No     Adah Wilbert LABOR, FNP 04/30/24 1407  "

## 2024-04-30 NOTE — Discharge Instructions (Signed)
 I believe this is a lymph node. May be inflamed from psoriasis or other cause. You can try warm compresses to the area. Ibuprofen  for pain and swelling.  If persists past 2 weeks see your PCP
